# Patient Record
Sex: Male | Born: 1990 | Race: White | Hispanic: No | Marital: Single | Smoking: Current some day smoker
Health system: Southern US, Community
[De-identification: ages and names within clinical notes are randomized; demographics above are authoritative.]

---

## 2015-01-01 ENCOUNTER — Encounter (HOSPITAL_COMMUNITY): Payer: Self-pay | Admitting: Emergency Medicine

## 2015-01-01 ENCOUNTER — Emergency Department (HOSPITAL_COMMUNITY)
Admission: EM | Admit: 2015-01-01 | Discharge: 2015-01-02 | Disposition: A | Discharge status: Home / Self Care | Payer: 59 | Source: Home / Self Care | Attending: Emergency Medicine | Admitting: Emergency Medicine

## 2015-01-01 DIAGNOSIS — R5383 Other fatigue: Secondary | ICD-10-CM | POA: Insufficient documentation

## 2015-01-01 DIAGNOSIS — Z720 Tobacco use: Secondary | ICD-10-CM

## 2015-01-01 DIAGNOSIS — E860 Dehydration: Secondary | ICD-10-CM | POA: Insufficient documentation

## 2015-01-01 DIAGNOSIS — H538 Other visual disturbances: Secondary | ICD-10-CM | POA: Insufficient documentation

## 2015-01-01 DIAGNOSIS — R112 Nausea with vomiting, unspecified: Secondary | ICD-10-CM | POA: Insufficient documentation

## 2015-01-01 DIAGNOSIS — R42 Dizziness and giddiness: Secondary | ICD-10-CM | POA: Insufficient documentation

## 2015-01-01 DIAGNOSIS — M6281 Muscle weakness (generalized): Secondary | ICD-10-CM | POA: Insufficient documentation

## 2015-01-01 DIAGNOSIS — H81399 Other peripheral vertigo, unspecified ear: Secondary | ICD-10-CM | POA: Diagnosis not present

## 2015-01-01 LAB — CBC
HCT: 48.6 % (ref 39.0–52.0)
HEMOGLOBIN: 17.2 g/dL — AB (ref 13.0–17.0)
MCH: 29.5 pg (ref 26.0–34.0)
MCHC: 35.4 g/dL (ref 30.0–36.0)
MCV: 83.2 fL (ref 78.0–100.0)
Platelets: 249 10*3/uL (ref 150–400)
RBC: 5.84 MIL/uL — AB (ref 4.22–5.81)
RDW: 12.5 % (ref 11.5–15.5)
WBC: 10.2 10*3/uL (ref 4.0–10.5)

## 2015-01-01 LAB — URINALYSIS, ROUTINE W REFLEX MICROSCOPIC
Glucose, UA: NEGATIVE mg/dL
Hgb urine dipstick: NEGATIVE
KETONES UR: 40 mg/dL — AB
LEUKOCYTES UA: NEGATIVE
NITRITE: NEGATIVE
PH: 5.5 (ref 5.0–8.0)
Protein, ur: NEGATIVE mg/dL
SPECIFIC GRAVITY, URINE: 1.029 (ref 1.005–1.030)
Urobilinogen, UA: 0.2 mg/dL (ref 0.0–1.0)

## 2015-01-01 LAB — COMPREHENSIVE METABOLIC PANEL
ALT: 44 U/L (ref 17–63)
AST: 28 U/L (ref 15–41)
Albumin: 4.7 g/dL (ref 3.5–5.0)
Alkaline Phosphatase: 74 U/L (ref 38–126)
Anion gap: 14 (ref 5–15)
BUN: 13 mg/dL (ref 6–20)
CALCIUM: 9.9 mg/dL (ref 8.9–10.3)
CO2: 24 mmol/L (ref 22–32)
Chloride: 101 mmol/L (ref 101–111)
Creatinine, Ser: 1.03 mg/dL (ref 0.61–1.24)
GFR calc Af Amer: >60 mL/min (ref >60)
Glucose, Bld: 99 mg/dL (ref 65–99)
Potassium: 3.9 mmol/L (ref 3.5–5.1)
Sodium: 139 mmol/L (ref 135–145)
Total Bilirubin: 1 mg/dL (ref 0.3–1.2)
Total Protein: 8.2 g/dL — ABNORMAL HIGH (ref 6.5–8.1)

## 2015-01-01 LAB — LIPASE, BLOOD: Lipase: 18 U/L — ABNORMAL LOW (ref 22–51)

## 2015-01-01 MED ORDER — ONDANSETRON 4 MG PO TBDP
4.0000 mg | ORAL_TABLET | Freq: Once | ORAL | Status: AC | PRN
  Administered 2015-01-01: 4 mg via ORAL

## 2015-01-01 MED ORDER — ONDANSETRON HCL 4 MG PO TABS: 4.0000 mg | ORAL_TABLET | Freq: Three times a day (TID) | ORAL | Status: AC | PRN

## 2015-01-01 MED ORDER — ONDANSETRON 4 MG PO TBDP
ORAL_TABLET | ORAL | Status: AC
  Administered 2015-01-01: 4 mg via ORAL
  Filled 2015-01-01: qty 1

## 2015-01-01 MED ORDER — SODIUM CHLORIDE 0.9 % IV BOLUS (SEPSIS)
1000.0000 mL | Freq: Once | INTRAVENOUS | Status: AC
  Administered 2015-01-01: 1000 mL via INTRAVENOUS

## 2015-01-01 MED ORDER — ONDANSETRON HCL 4 MG/2ML IJ SOLN
4.0000 mg | Freq: Once | INTRAMUSCULAR | Status: AC
  Administered 2015-01-01: 4 mg via INTRAVENOUS
  Filled 2015-01-01: qty 2

## 2015-01-01 NOTE — Discharge Instructions (Signed)

## 2015-01-01 NOTE — ED Provider Notes (Signed)
CSN: [id number]     Arrival date & time 01/01/15  2011 History   First MD Initiated Contact with Patient 01/01/15 2202     Chief Complaint  Patient presents with  . Dizziness  . Emesis     (Consider location/radiation/quality/duration/timing/severity/associated sxs/prior Treatment) HPI   24 year old generally healthy male here with dizziness. Pt states his birthday was 2 days ago.  He admits to consuming alcohol (had 9 shots of liquor).  Woke up the next day and felt fine.  Patient reportedly work outside in his garage all day yesterday in extreme heat. Despite staying hydrated by drinking "alot of water" pt reportedly felt dizzy by mid day, having blurred vision, and generally weak and fatigued. Symptoms worsen with positional change. Today he has been endorsing nausea and has vomited multiple times. He cannot keep anything down. Continue to endorse dizziness. He described dizziness as "he is spinning". Also endorsed intermittent chest pain on palpation full the past several months none currently. He denies having any fever, headache, ear pain, loss of hearing, chest pain, difficulty breathing, productive cough, abdominal pain, back pain, dysuria, focal numbness or weakness. No recent travel or eating exotic food. Report occasional shortness of breath during the dizzy episode but not currently.       History reviewed. No pertinent past medical history. History reviewed. No pertinent past surgical history. History reviewed. No pertinent family history. History  Substance Use Topics  . Smoking status: Current Some Day Smoker  . Smokeless tobacco: Not on file  . Alcohol Use: Yes    Review of Systems  All other systems reviewed and are negative.     Allergies  Review of patient's allergies indicates no known allergies.  Home Medications   Prior to Admission medications   Not on File   BP 151/90 mmHg  Pulse 88  Temp(Src) 97.7 F (36.5 C) (Oral)  Resp 16  Ht  (1.854 m)   Wt 215 lb (97.523 kg)  BMI 28.37 kg/m2  SpO2 96% Physical Exam  Constitutional: He is oriented to person, place, and time. He appears well-developed and well-nourished. No distress.  Awake, alert, nontoxic appearance  HENT:  Head: Atraumatic.  Right Ear: External ear normal.  Left Ear: External ear normal.  Mouth/Throat: Oropharynx is clear and moist.  Eyes: Conjunctivae and EOM are normal. Pupils are equal, round, and reactive to light. Right eye exhibits no discharge. Left eye exhibits no discharge.  No nystagmus  Neck: Normal range of motion. Neck supple.  Cardiovascular: Normal rate and regular rhythm.   Pulmonary/Chest: Effort normal. No respiratory distress. He exhibits no tenderness.  Abdominal: Soft. There is no tenderness. There is no rebound.  Musculoskeletal: He exhibits no tenderness.  ROM appears intact, no obvious focal weakness  Neurological: He is alert and oriented to person, place, and time.  Normal gait.  Skin: Skin is warm and dry. No rash noted.  Psychiatric: He has a normal mood and affect.  Nursing note and vitals reviewed.   ED Course  Procedures (including critical care time)  Patient presents with symptoms suggestive of heat exhaustion. He has a benign abdomen suspicion for biliary disease or gastroenteritis. No acute abdomen. Urine with 40 ketones and evidence of hemoconcentration suggestive of dehydration. No signs of infection. Will give IV fluid and antinausea medication and will monitor closely. Symptoms likely peripheral, doubt central vertigo.  12:00 AM Pt felt better after receiving IVF and antinausea medication.  Able to tolerates PO.  Stable for discharge.  Labs Review Labs Reviewed  LIPASE, BLOOD - Abnormal; Notable for the following:    Lipase 18 (*)    All other components within normal limits  COMPREHENSIVE METABOLIC PANEL - Abnormal; Notable for the following:    Total Protein 8.2 (*)    All other components within normal limits   CBC - Abnormal; Notable for the following:    RBC 5.84 (*)    Hemoglobin 17.2 (*)    All other components within normal limits  URINALYSIS, ROUTINE W REFLEX MICROSCOPIC (NOT AT [facility]) - Abnormal; Notable for the following:    Bilirubin Urine SMALL (*)    Ketones, ur 40 (*)    All other components within normal limits    Imaging Review No results found.   EKG Interpretation None      MDM   Final diagnoses:  Non-intractable vomiting with nausea, vomiting of unspecified type  Dehydration    BP 124/77 mmHg  Pulse 70  Temp(Src) 97.5 F (36.4 C) (Oral)  Resp 16  Ht 6\' 1"  (1.854 m)  Wt 215 lb (97.523 kg)  BMI 28.37 kg/m2  SpO2 97%     Fayrene Helper, PA-C 01/02/15 0001  Pricilla Loveless, MD 01/02/15 [phone]

## 2015-01-01 NOTE — ED Notes (Signed)
Patient here with complaining of nausea, emesis, and dizziness. Onset about 2 days ago. States heavy night of drinking on Thurs and hasn't felt right since that time. States 15-20 bouts of vomiting. Actively vomiting in triage.

## 2015-01-02 ENCOUNTER — Emergency Department (HOSPITAL_COMMUNITY): Payer: 59

## 2015-01-02 ENCOUNTER — Encounter (HOSPITAL_COMMUNITY): Payer: Self-pay | Admitting: Emergency Medicine

## 2015-01-02 ENCOUNTER — Emergency Department (HOSPITAL_COMMUNITY)
Admission: EM | Admit: 2015-01-02 | Discharge: 2015-01-02 | Disposition: A | Discharge status: Home / Self Care | Payer: 59 | Attending: Emergency Medicine | Admitting: Emergency Medicine

## 2015-01-02 DIAGNOSIS — H81399 Other peripheral vertigo, unspecified ear: Secondary | ICD-10-CM

## 2015-01-02 DIAGNOSIS — R112 Nausea with vomiting, unspecified: Secondary | ICD-10-CM | POA: Insufficient documentation

## 2015-01-02 DIAGNOSIS — Z720 Tobacco use: Secondary | ICD-10-CM | POA: Insufficient documentation

## 2015-01-02 LAB — CBC WITH DIFFERENTIAL/PLATELET
Basophils Absolute: 0 10*3/uL (ref 0.0–0.1)
Basophils Relative: 0 % (ref 0–1)
EOS ABS: 0 10*3/uL (ref 0.0–0.7)
EOS PCT: 0 % (ref 0–5)
HEMATOCRIT: 45.1 % (ref 39.0–52.0)
Hemoglobin: 15.9 g/dL (ref 13.0–17.0)
LYMPHS ABS: 2.1 10*3/uL (ref 0.7–4.0)
Lymphocytes Relative: 20 % (ref 12–46)
MCH: 29.2 pg (ref 26.0–34.0)
MCHC: 35.3 g/dL (ref 30.0–36.0)
MCV: 82.8 fL (ref 78.0–100.0)
Monocytes Absolute: 0.5 10*3/uL (ref 0.1–1.0)
Monocytes Relative: 5 % (ref 3–12)
Neutro Abs: 7.9 10*3/uL — ABNORMAL HIGH (ref 1.7–7.7)
Neutrophils Relative %: 75 % (ref 43–77)
PLATELETS: 231 10*3/uL (ref 150–400)
RBC: 5.45 MIL/uL (ref 4.22–5.81)
RDW: 12.4 % (ref 11.5–15.5)
WBC: 10.5 10*3/uL (ref 4.0–10.5)

## 2015-01-02 LAB — COMPREHENSIVE METABOLIC PANEL
ALT: 34 U/L (ref 17–63)
ANION GAP: 9 (ref 5–15)
AST: 22 U/L (ref 15–41)
Albumin: 4.5 g/dL (ref 3.5–5.0)
Alkaline Phosphatase: 66 U/L (ref 38–126)
BUN: 11 mg/dL (ref 6–20)
CO2: 24 mmol/L (ref 22–32)
Calcium: 9.6 mg/dL (ref 8.9–10.3)
Chloride: 107 mmol/L (ref 101–111)
Creatinine, Ser: 1 mg/dL (ref 0.61–1.24)
GFR calc non Af Amer: >60 mL/min (ref >60)
Glucose, Bld: 87 mg/dL (ref 65–99)
Potassium: 3.9 mmol/L (ref 3.5–5.1)
SODIUM: 140 mmol/L (ref 135–145)
Total Bilirubin: 0.9 mg/dL (ref 0.3–1.2)
Total Protein: 8 g/dL (ref 6.5–8.1)

## 2015-01-02 LAB — LIPASE, BLOOD: LIPASE: 23 U/L (ref 22–51)

## 2015-01-02 MED ORDER — SODIUM CHLORIDE 0.9 % IV BOLUS (SEPSIS)
1000.0000 mL | Freq: Once | INTRAVENOUS | Status: AC
  Administered 2015-01-02: 1000 mL via INTRAVENOUS

## 2015-01-02 MED ORDER — ONDANSETRON 4 MG PO TBDP
4.0000 mg | ORAL_TABLET | Freq: Once | ORAL | Status: AC | PRN
  Administered 2015-01-02: 4 mg via ORAL

## 2015-01-02 MED ORDER — ONDANSETRON 4 MG PO TBDP
ORAL_TABLET | ORAL | Status: AC
  Filled 2015-01-02: qty 1

## 2015-01-02 MED ORDER — MECLIZINE HCL 25 MG PO TABS
25.0000 mg | ORAL_TABLET | Freq: Once | ORAL | Status: AC
Start: 2015-01-02 — End: 2015-01-02
  Administered 2015-01-02: 25 mg via ORAL
  Filled 2015-01-02: qty 1

## 2015-01-02 MED ORDER — MECLIZINE HCL 50 MG PO TABS: 25.0000 mg | ORAL_TABLET | Freq: Three times a day (TID) | ORAL | Status: AC | PRN

## 2015-01-02 NOTE — ED Notes (Signed)
Pt pulse ox while ambulating was 99%. Pt heart rate was 99.

## 2015-01-02 NOTE — ED Notes (Signed)
Pt sts N/V and dizziness; pt seen here last night for same and not feeling better

## 2015-01-02 NOTE — ED Provider Notes (Signed)
CSN: [id number]     Arrival date & time 01/02/15  1625 History   First MD Initiated Contact with Patient 01/02/15 1814     Chief Complaint  Patient presents with  . Dizziness     (Consider location/radiation/quality/duration/timing/severity/associated sxs/prior Treatment) HPI   24 year old male who presents for evaluation of nausea vomiting and dizziness. Patient report he drank alcohol 3 days ago for his birthday. The next day he worked out in his garage in the heat and subsequently became dizzy which he described as "I'm spinning but the surrounding is still". The next day he became nauseous and has vomited multiple times. He was seen in the ED yesterday for this complaint. He is found to be dehydrated and was given IV fluid and antinausea medication. He was given a prescription of Zofran at discharge. Patient states he did not fill the medication. Today he did vomit once and continue to endorse dizziness which prompted him to return to ER.  He received a zofran in triage and now felt better and nausea has resolved.  Dizziness still persists.  No headache, neck pain, vision changes, new numbness/weakness, cp, sob.    History reviewed. No pertinent past medical history. History reviewed. No pertinent past surgical history. History reviewed. No pertinent family history. History  Substance Use Topics  . Smoking status: Current Some Day Smoker  . Smokeless tobacco: Not on file  . Alcohol Use: Yes    Review of Systems  All other systems reviewed and are negative.     Allergies  Review of patient's allergies indicates no known allergies.  Home Medications   Prior to Admission medications   Medication Sig Start Date End Date Taking? Authorizing Provider  ondansetron (ZOFRAN) 4 MG tablet Take 1 tablet (4 mg total) by mouth every 8 (eight) hours as needed for nausea or vomiting. 01/01/15  Yes Fayrene Helper, PA-C   BP 124/81 mmHg  Pulse 67  Temp(Src) 97.6 F (36.4 C) (Oral)  Resp 18   SpO2 97% Physical Exam  Constitutional: He is oriented to person, place, and time. He appears well-developed and well-nourished. No distress.  HENT:  Head: Atraumatic.  Right Ear: External ear normal.  Left Ear: External ear normal.  Eyes: Conjunctivae and EOM are normal. Pupils are equal, round, and reactive to light.  Neck: Normal range of motion. Neck supple.  No nuchal rigidity  Neurological: He is alert and oriented to person, place, and time.  Neurologic exam:  Speech clear, pupils equal round reactive to light, extraocular movements intact  Normal peripheral visual fields Cranial nerves III through XII normal including no facial droop Follows commands, moves all extremities x4, normal strength to bilateral upper and lower extremities at all major muscle groups including grip Sensation normal to light touch  Coordination intact, no limb ataxia, finger-nose-finger normal Rapid alternating movements normal No pronator drift Gait normal   Skin: No rash noted.  Psychiatric: He has a normal mood and affect.  Nursing note and vitals reviewed.   ED Course  Procedures (including critical care time)  7:03 PM Patient here with persistent dizziness for the past 2 days. Pt was seen yesterday and was found to be dehydrated and was given fluid and antinausea medication. Nausea has improved however patient continues to endorse dizziness. His gait is mildly unstable when he looks up. He is concerned, therefore will obtain brain MRI to rule out posterior circulation stroke although my suspicion is low. Care discussed with Dr. Criss Alvine.  9:44 PM Labs are  reassuring, brain MRI without contrast shows no evidence of acute intracranial pathology. Patient was reassured. He is currently receiving IV fluid and antinausea medication along with anti-dizziness medication. He has normal orthostatic vital sign.  9:48 PM Pt felt normal while lying, but when ambulate HR rises to 99.  He will need to f/u  with PCP for further evaluation, possibly rule out for POTS.    18:31:54 Orthostatic Vital Signs AR  Orthostatic Lying  - BP- Lying: 125/73 mmHg ; Pulse- Lying: 86  Orthostatic Sitting - BP- Sitting: 140/74 mmHg ; Pulse- Sitting: 68  Orthostatic Standing at 0 minutes - BP- Standing at 0 minutes: 143/86 mmHg ; Pulse- Standing at 0 minutes: 75       Labs Review Labs Reviewed  CBC WITH DIFFERENTIAL/PLATELET - Abnormal; Notable for the following:    Neutro Abs 7.9 (*)    All other components within normal limits  COMPREHENSIVE METABOLIC PANEL  LIPASE, BLOOD    Imaging Review Mr Brain Wo Contrast  01/02/2015   CLINICAL DATA:  Nausea, vomiting and dizziness for the last 2 days.  EXAM: MRI HEAD WITHOUT CONTRAST  TECHNIQUE: Multiplanar, multiecho pulse sequences of the brain and surrounding structures were obtained without intravenous contrast.  COMPARISON:  None.  FINDINGS: The brain has a normal appearance on all pulse sequences without evidence of malformation, atrophy, old or acute infarction, mass lesion, hemorrhage, hydrocephalus or extra-axial collection. No pituitary mass. No fluid in the sinuses, middle ears or mastoids. No skull or skullbase lesion. There is flow in the major vessels at the base of the brain. Major venous sinuses show flow.  IMPRESSION: Normal examination.  No abnormality seen to explain dizziness.   Electronically Signed   By: Paulina Fusi M.D.   On: 01/02/2015 20:13     EKG Interpretation None      MDM   Final diagnoses:  Peripheral vertigo, unspecified laterality    BP 134/86 mmHg  Pulse 57  Temp(Src) 97.6 F (36.4 C) (Oral)  Resp 16  SpO2 99%  I have reviewed nursing notes and vital signs. I personally viewed the imaging tests through PACS system and agrees with radiologist's intepretation I reviewed available ER/hospitalization records through the EMR      Fayrene Helper, PA-C 01/02/15 2156  Pricilla Loveless, MD 01/05/15 [phone]

## 2015-01-02 NOTE — ED Notes (Addendum)
Pt here for nausea vomiting and dizziness. Seen yesterday for same. Pt visitor at bedside states patient is actually a lot better than yesterday. Pt did not get his zofran prescription filled. Pt given zofran in triage and states his nausea is gone. PT ate [employer] while waiting and has kept it down.

## 2015-01-02 NOTE — Discharge Instructions (Signed)
Dizziness °Dizziness is a common problem. It is a feeling of unsteadiness or light-headedness. You may feel like you are about to faint. Dizziness can lead to injury if you stumble or fall. A person of any age group can suffer from dizziness, but dizziness is more common in older adults. °CAUSES  °Dizziness can be caused by many different things, including: °· Middle ear problems. °· Standing for too long. °· Infections. °· An allergic reaction. °· Aging. °· An emotional response to something, such as the sight of blood. °· Side effects of medicines. °· Tiredness. °· Problems with circulation or blood pressure. °· Excessive use of alcohol or medicines, or illegal drug use. °· Breathing too fast (hyperventilation). °· An irregular heart rhythm (arrhythmia). °· A low red blood cell count (anemia). °· Pregnancy. °· Vomiting, diarrhea, fever, or other illnesses that cause body fluid loss (dehydration). °· Diseases or conditions such as Parkinson's disease, high blood pressure (hypertension), diabetes, and thyroid problems. °· Exposure to extreme heat. °DIAGNOSIS  °Your health care provider will ask about your symptoms, perform a physical exam, and perform an electrocardiogram (ECG) to record the electrical activity of your heart. Your health care provider may also perform other heart or blood tests to determine the cause of your dizziness. These may include: °· Transthoracic echocardiogram (TTE). During echocardiography, sound waves are used to evaluate how blood flows through your heart. °· Transesophageal echocardiogram (TEE). °· Cardiac monitoring. This allows your health care provider to monitor your heart rate and rhythm in real time. °· Holter monitor. This is a portable device that records your heartbeat and can help diagnose heart arrhythmias. It allows your health care provider to track your heart activity for several days if needed. °· Stress tests by exercise or by giving medicine that makes the heart beat  faster. °TREATMENT  °Treatment of dizziness depends on the cause of your symptoms and can vary greatly. °HOME CARE INSTRUCTIONS  °· Drink enough fluids to keep your urine clear or pale yellow. This is especially important in very hot weather. In older adults, it is also important in cold weather. °· Take your medicine exactly as directed if your dizziness is caused by medicines. When taking blood pressure medicines, it is especially important to get up slowly. °¨ Rise slowly from chairs and steady yourself until you feel okay. °¨ In the morning, first sit up on the side of the bed. When you feel okay, stand slowly while holding onto something until you know your balance is fine. °· Move your legs often if you need to stand in one place for a long time. Tighten and relax your muscles in your legs while standing. °· Have someone stay with you for 1-2 days if dizziness continues to be a problem. Do this until you feel you are well enough to stay alone. Have the person call your health care provider if he or she notices changes in you that are concerning. °· Do not drive or use heavy machinery if you feel dizzy. °· Do not drink alcohol. °SEEK IMMEDIATE MEDICAL CARE IF:  °· Your dizziness or light-headedness gets worse. °· You feel nauseous or vomit. °· You have problems talking, walking, or using your arms, hands, or legs. °· You feel weak. °· You are not thinking clearly or you have trouble forming sentences. It may take a friend or family member to notice this. °· You have chest pain, abdominal pain, shortness of breath, or sweating. °· Your vision changes. °· You notice   any bleeding. °· You have side effects from medicine that seems to be getting worse rather than better. °MAKE SURE YOU:  °· Understand these instructions. °· Will watch your condition. °· Will get help right away if you are not doing well or get worse. °Document Released: 11/21/2000 Document Revised: 06/02/2013 Document Reviewed: 12/15/2010 °ExitCare®  Patient Information ©2015 [employer]. This information is not intended to replace advice given to you by your health care provider. Make sure you discuss any questions you have with your health care provider. ° ° °Emergency Department Resource Guide °1) Find a Doctor and Pay Out of Pocket °Although you won't have to find out who is covered by your insurance plan, it is a good idea to ask around and get recommendations. You will then need to call the office and see if the doctor you have chosen will accept you as a new patient and what types of options they offer for patients who are self-pay. Some doctors offer discounts or will set up payment plans for their patients who do not have insurance, but you will need to ask so you aren't surprised when you get to your appointment. ° °2) Contact Your Local Health Department °Not all health departments have doctors that can see patients for sick visits, but many do, so it is worth a call to see if yours does. If you don't know where your local health department is, you can check in your phone book. The CDC also has a tool to help you locate your state's health department, and many state websites also have listings of all of their local health departments. ° °3) Find a Walk-in Clinic °If your illness is not likely to be very severe or complicated, you may want to try a walk in clinic. These are popping up all over the country in pharmacies, drugstores, and shopping centers. They're usually staffed by nurse practitioners or physician assistants that have been trained to treat common illnesses and complaints. They're usually fairly quick and inexpensive. However, if you have serious medical issues or chronic medical problems, these are probably not your best option. ° °No Primary Care Doctor: °- Call [facility] at  832-8000 - they can help you locate a primary care doctor that  accepts your insurance, provides certain services, etc. °- Physician Referral Service-  1-800-533-3463 ° °Chronic Pain Problems: °Organization         Address  Phone   Notes  °[facility]  (336) 297-2271 Patients need to be referred by their primary care doctor.  ° °Medication Assistance: °Organization         Address  Phone   Notes  °[place] [facility] [address] °[place], [place] [address] (336) 641-8030 --Must be a resident of [place] °-- Must have NO insurance coverage whatsoever (no Medicaid/ Medicare, etc.) °-- The pt. MUST have a primary care doctor that directs their care regularly and follows them in the community °  °[facility]  (866) 331-1348   °[employer]  (888) 892-1162   ° °Agencies that provide inexpensive medical care: °Organization         Address  Phone   Notes  °[facility]  (336) 832-8035   °[facility]    (336) 832-7272   °[facility] [address] °[place], [place] [address] (336) 832-4777   °[facility] [address], °[place] (336) 271-4999   °[facility]    (  336) 373-0678   °[facility]    (336) 272-1050   °[facility] ° [address], [place] Phone:  (336) 832-4444, Fax:  (336) 832-4440 Hours of Operation:  9 am - 6 pm, M-F.  Also accepts Medicaid/Medicare and self-pay.  °[facility] ° [address], [place] Phone: (336) 832-3150, Fax: (336) 832-3151. Hours of Operation:  8:30 am - 5:30 pm, M-F.  Also accepts Medicaid and self-pay.  °[facility] [place] [address], [place] Phone: (336) 878-6027   °[facility] [address], [place], [place] (336)723-1848, Ext. 123 Mondays & Thursdays: 7-9 AM.  First 15 patients are seen on a first come, first serve basis. °  ° °Medicaid-accepting [place] Providers: ° °Organization         Address  Phone   Notes  °[facility] [address],  [place] (336) 641-2100 Also accepts self-pay patients.  °[facility] [address], [place] ° (336) 856-9996   °[facility] [address], [place] (336) 288-8857   °[facility] [address] [place] [address], [place] (336) 299-7000   °Veita Bland [address], [place]  ° (336) 373-1557 Only accepts [place] Access Medicaid patients after they have their name applied to their card.  ° °Self-Pay (no insurance) in [place]: ° °Organization         Address  Phone   Notes  °Sickle Cell Patients, [facility] [address], [place] (336) 832-1970   °[facility] [address], [place] (336) 832-4400   °[facility] ° [address], [place] (336) 992-4800   °[facility]/Dr. Osei-Bonsu ° [address] [place] [address], [place] or [address], [place] (336) [phone] Phone number for both [place] and [place] locations is the same.  °[facility] [address], [place] (336) 299-0000   °[facility] [address] [place] [address], [place] or [address] (336) 852-7530 °(336) 878-2260   °[facility] [address],  (336) 350-1642, phone; (336) 294-5005, fax Sees patients 1st and 3rd Saturday of every month.  Must not qualify for public or private insurance (i.e. Medicaid, Medicare, [place] Health Choice, Veterans' Benefits) • Household income should be no more than 200% of the poverty level •The clinic cannot treat you if you are pregnant or think you are pregnant • Sexually transmitted diseases are not treated at the clinic.  ° ° °Dental Care: °Organization         Address  Phone  Notes  °[facility] [address],  (336) 641-6152 Accepts children up to age 21 who are enrolled in  Medicaid or [place] Health Choice; pregnant women with a Medicaid card; and children who have applied for Medicaid or [place] Health Choice, but were declined, whose parents can pay a reduced fee at time of service.  °[facility] [place]  [address], [place] (336) 641-7733 Accepts children up to age 21 who are enrolled in Medicaid or [place] Health Choice; pregnant women with a Medicaid card; and children who have applied for Medicaid or  Health Choice, but were declined, whose parents can pay a   reduced fee at time of service.  °[facility] ° [address], [place] (336) 641-4533 Patients are seen by appointment only. Walk-ins are not accepted. [facility] will see patients 18 years of age and older. °Monday - Tuesday (8am-5pm) °Most Wednesdays (8:30-5pm) °$30 per visit, cash only  °[facility] ° [address], [place] (336) 641-4533 Patients are seen by appointment only. Walk-ins are not accepted. [facility] will see patients 18 years of age and older. °One Wednesday Evening (Monthly: Volunteer Based).  $30 per visit, cash only  °[facility]  (919) 537-3737 for adults; Children under age 4, call Graduate Pediatric Dentistry at ([phone]) [phone]. Children aged 4-14, please call ([phone]) [phone] to request a pediatric application. ° Dental services are provided in all areas of dental care including fillings, crowns and bridges, complete and partial dentures, implants, gum treatment, root canals, and extractions. Preventive care is also provided. Treatment is provided to both adults and children. °Patients are selected via a lottery and there is often a waiting list. °  °[facility] [address], °[place] ° (336) 763-8833 [email or web address] °  °[facility] [address], [place], [place] (336)723-1848, Ext. 123 Second and Fourth Thursday of each month, opens at 6:30  AM; Clinic ends at 9 AM.  Patients are seen on a first-come first-served basis, and a limited number are seen during each clinic.  ° °[facility] ° [address], [place], [place] (336) 723-7904   Eligibility Requirements °You must have lived in [place], [place], or [place] counties for at least the last three months. °  You cannot be eligible for state or federal sponsored healthcare insurance, including Veterans Administration, Medicaid, or Medicare. °  You generally cannot be eligible for healthcare insurance through your employer.  °  How to apply: °Eligibility screenings are held every Tuesday and Wednesday afternoon from 1:00 pm until 4:00 pm. You do not need an appointment for the interview!  °[facility] [address], [place], [place] 336-631-2330   °[facility]  336-342-8273   °[facility]  336-703-3100   ° [facility]  336-570-6415   ° °Behavioral Health Resources in the Community: °Intensive Outpatient Programs °Organization         Address  Phone  Notes  °[facility] [address], [place], [place] 336-878-6098   °[facility] [address], [place], [place] 336-832-9800   °[facility] [address], [place], [place] ° 336-882-2125   °[facility] [address],  °, [place] 1-800-853-5163 or 336-641-4981   °Substance Abuse Resources °Organization         Address  Phone  Notes  °[facility]  336-882-2125   °[facility]  336-784-9470   °[facility]  336-285-9073   °[facility]  336-845-3988   °Residential & Outpatient Substance Abuse Program  1-800-659-3381   °Psychological Services °Organization         Address  Phone  Notes  °[facility]  336- 832-9600   °[facility]  336- 378-7881   °[facility] [address],  1-800-853-5163 or  336-641-4981   ° °Mobile Crisis Teams °Organization         Address  Phone  Notes  °[facility], Mobile Crisis Care Unit  1-877-626-1772   °  Assertive °[facility] ° [address] [place], [place] 336-834-9664   °Sharon DeEsch [address] °[place] 336-554-5454   ° °Self-Help/Support Groups °Organization         Address  Phone             Notes  °[facility] - variety of support groups  336- 373-1402 Call for more information  °Narcotics Anonymous (NA), [facility] [address], °[place]  2 meetings at this location  ° °Residential Treatment Programs °Organization         Address  Phone  Notes  °[facility] [address],    °[place]  1-866-801-8205   °[facility] ° [address], [place], [place] 704-293-8524   °[facility] [address], [place] 336-845-3988 Admissions: 8am-3pm M-F  °[facility] [address]    °[place], [place] 336-841-1104   °[facility] [address], [place], [place] 336-379-7146   °[facility] [address]  °[place], [place] 336-285-9073   °[facility] Intensive Outpatient [address], [place], [place] 336-852-3033   °[facility] [address]  °[place], [place] 1-877-615-2722 or 336-784-9470   °[facility] [address] [place], [place] 336-227-7417 Accepts Medicaid  °[facility] [address]  °[place] 1-800-659-3381 Substance Abuse/Addiction Treatment  ° °[place] Behavioral Health Resources °Organization         Address  Phone  Notes  °[facility]  (888) 581-9988   °Julie Brannon, PhD [address] [place], [place]   (336) 349-5553 or (336) 951-0000   °[facility]   [address] °[place], [place] (336) 349-4454   °[facility] [address],  [place], [place] (336) 342-8316 Insurance/Medicaid/sponsorship through [facility]  °[facility] [address]                                    [place], [place] (336) 342-8316 Therapy/tele-psych/case  °[facility] [address]  ° [place], [place] (336) 349-2233    °Dr. Arfeen  (336) 349-4544   °[facility] [place]  [employer] [place] [facility] 1) [address], [place] °2) [address], [place] °3)  [address], [place] (336) 349-3220 °(336) 342-7768 ° °(336) 342-8140   °[place] [facility] (336) 342-1394 or (336) 342-3537 (After Hours)    ° °  °

## 2016-09-21 IMAGING — MR MR HEAD W/O CM
9 of 11 series · 33 of 48 positions shown · non-contrast
Comparison: None.

CLINICAL DATA: Nausea, vomiting and dizziness for the last 2 days.

EXAM:
MRI HEAD WITHOUT CONTRAST
TECHNIQUE: Multiplanar, multiecho pulse sequences of the brain and surrounding
structures were obtained without intravenous contrast.

[Series 3: DWI · axial · 3.0mm · 0.94mm/px · z∈[-95,+70]mm · 7 of 112 slices shown (1 of 4)]
[im 1/112]
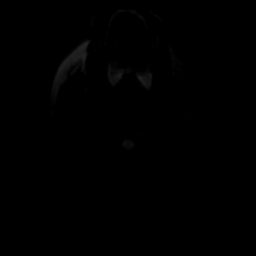
[im 19/112]
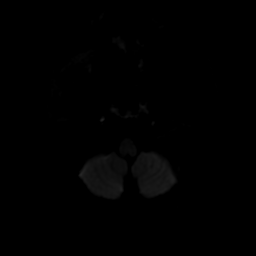
[im 38/112]
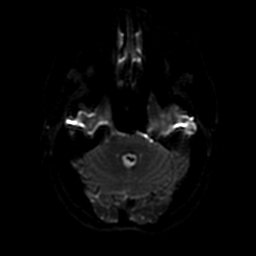
[im 56/112]
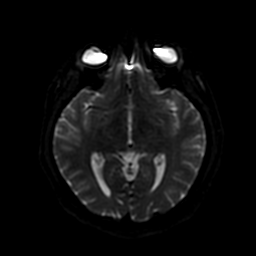
[im 75/112]
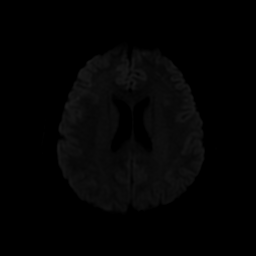
[im 93/112]
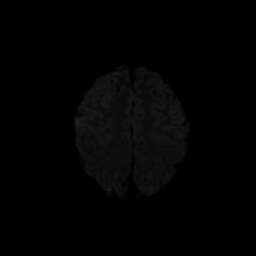
[im 112/112]
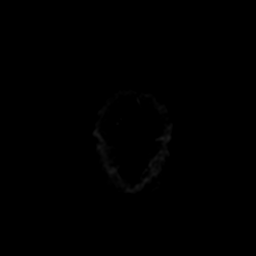

[Series 4: FLAIR · sagittal · 5.0mm · 0.47mm/px · 2 of 26 slices shown (1 of 2)]
[im 1/26]
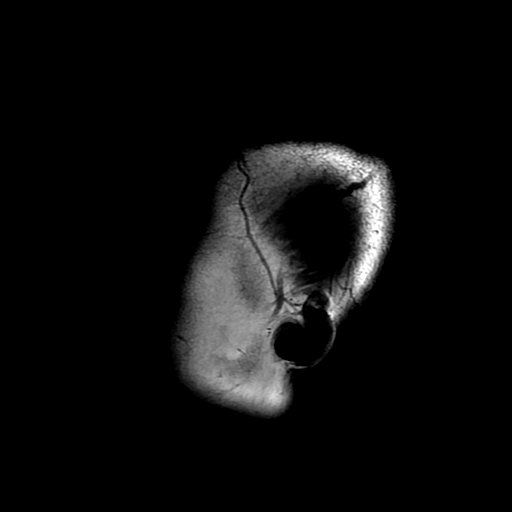
[im 26/26]
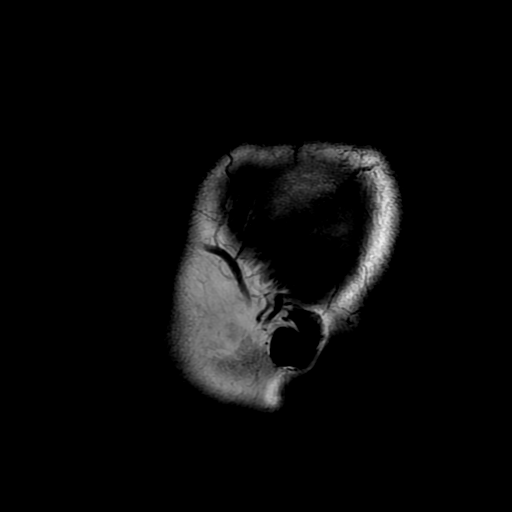

[Series 5: T2 · axial · 5.0mm · 0.47mm/px · z∈[-74,+94]mm · 2 of 29 slices shown (1 of 2)]
[im 1/29]
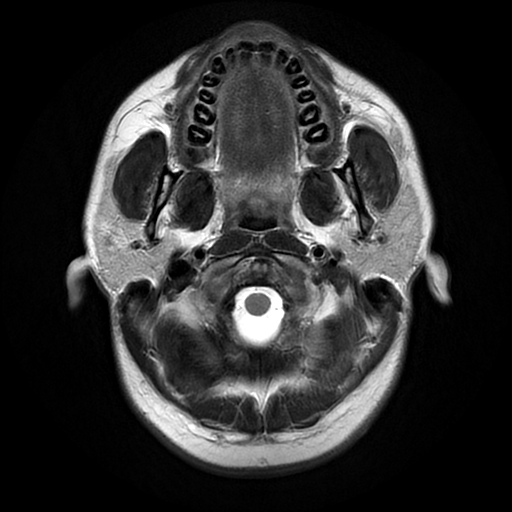
[im 29/29]
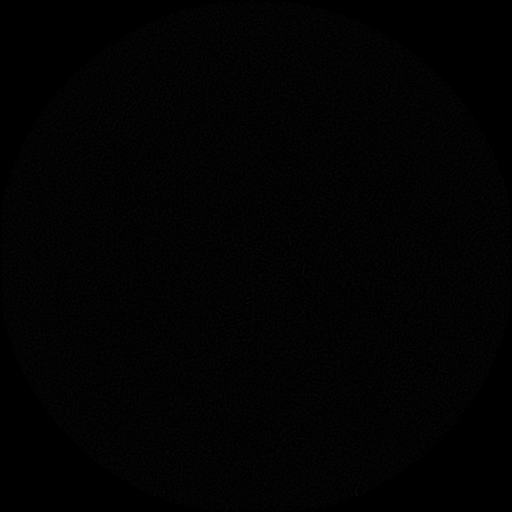

[Series 6: FLAIR · axial · 5.0mm · 0.47mm/px · z∈[-74,+94]mm · 2 of 29 slices shown (2 of 2)]
[im 1/29]
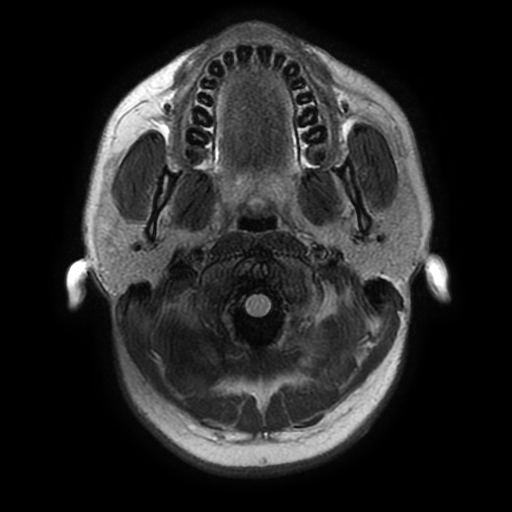
[im 29/29]
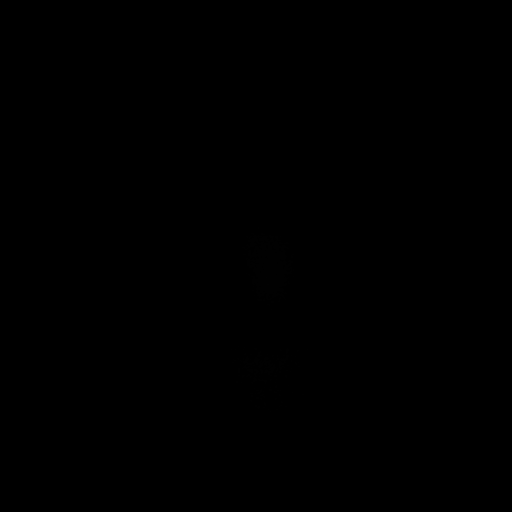

[Series 7: DWI · coronal · 5.0mm · 0.94mm/px · 5 of 76 slices shown (2 of 4)]
[im 1/76]
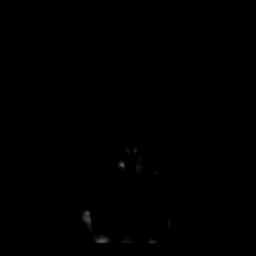
[im 19/76]
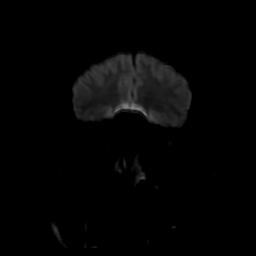
[im 38/76]
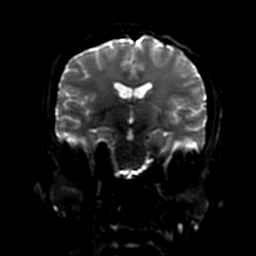
[im 57/76]
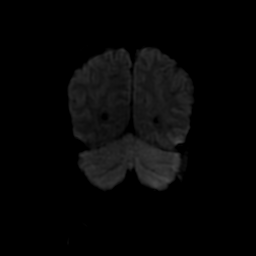
[im 76/76]
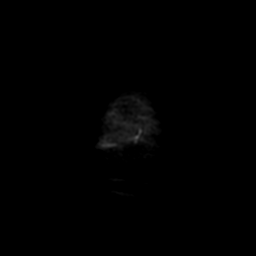

[Series 8: (person_name) · axial · 3.0mm · 0.47mm/px · z∈[-83,+31]mm · 6 of 108 slices shown]
[im 1/108]
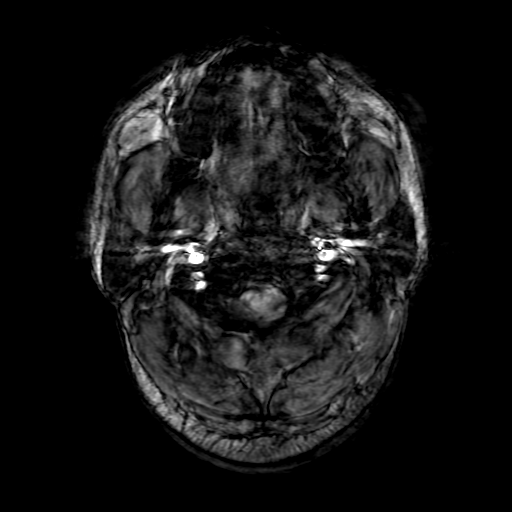
[im 16/108]
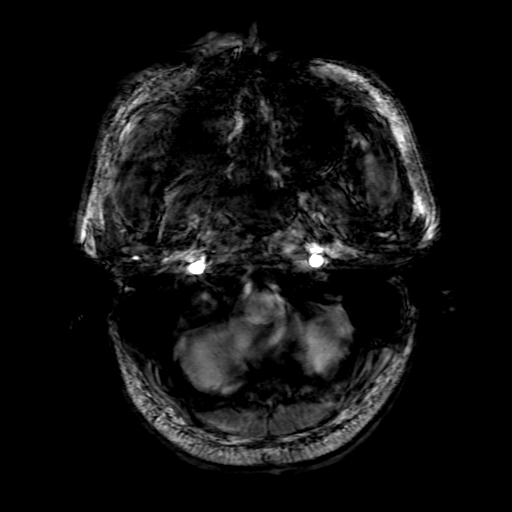
[im 31/108]
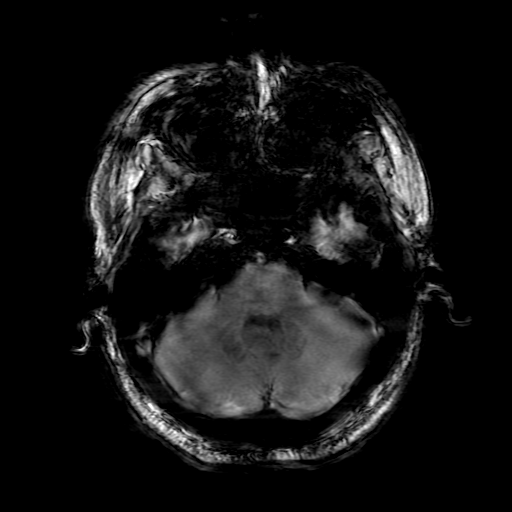
[im 46/108]
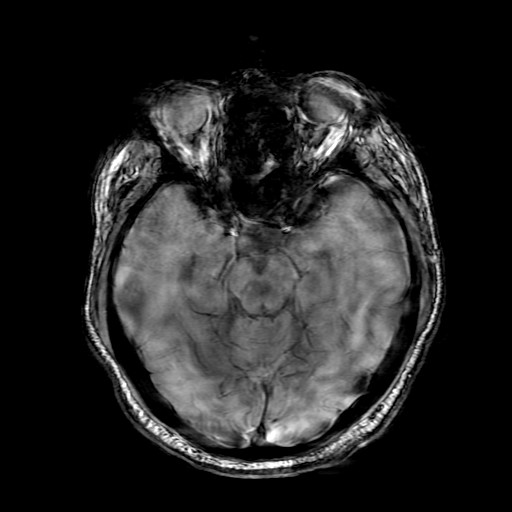
[im 62/108]
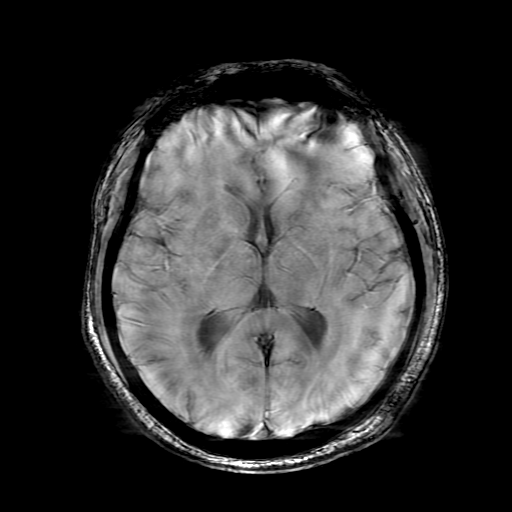
[im 77/108]
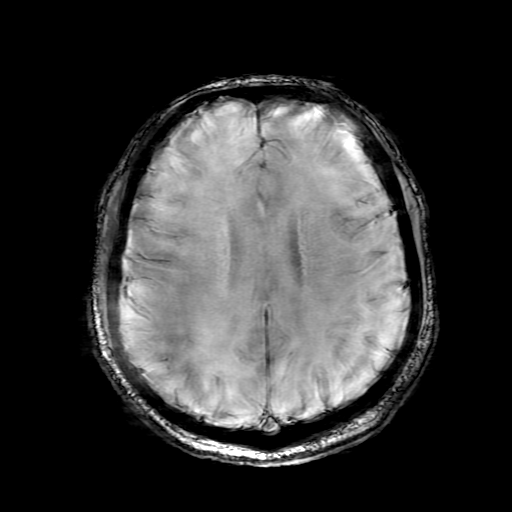

[Series 10: T2 · coronal · 5.0mm · 0.47mm/px · 2 of 29 slices shown (2 of 2)]
[im 1/29]
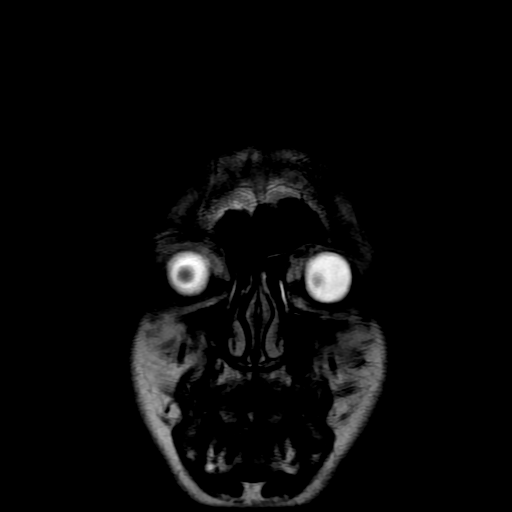
[im 29/29]
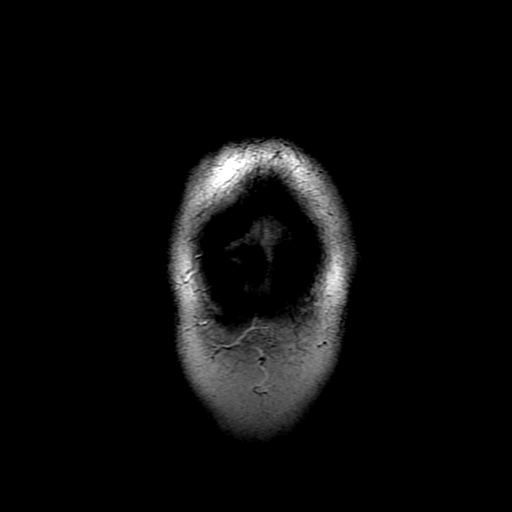

[Series 300: DWI · axial · 3.0mm · 0.94mm/px · z∈[-95,+70]mm · 4 of 56 slices shown (3 of 4)]
[im 1/56]
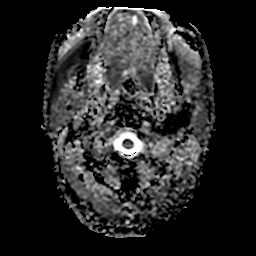
[im 19/56]
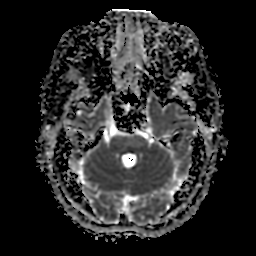
[im 37/56]
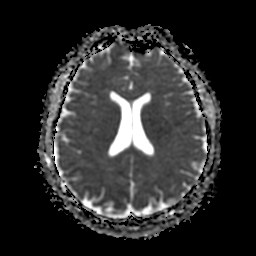
[im 56/56]
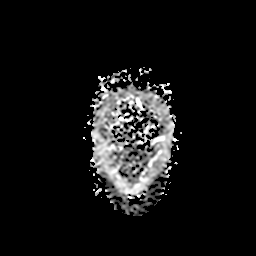

[Series 700: DWI · coronal · 5.0mm · 0.94mm/px · 3 of 38 slices shown (4 of 4)]
[im 1/38]
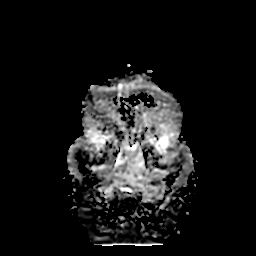
[im 19/38]
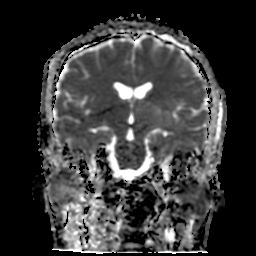
[im 38/38]
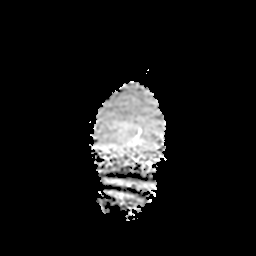

[33 of 48 positions shown; findings below may reference images not displayed]

FINDINGS: The brain has a normal appearance on all pulse sequences without
evidence of malformation, atrophy, old or acute infarction, mass
lesion, hemorrhage, hydrocephalus or extra-axial collection. No
pituitary mass. No fluid in the sinuses, middle ears or mastoids. No
skull or skullbase lesion. There is flow in the major vessels at the
base of the brain. Major venous sinuses show flow.
IMPRESSION: Normal examination.  No abnormality seen to explain dizziness.

## 2019-08-14 ENCOUNTER — Encounter (HOSPITAL_COMMUNITY): Payer: Self-pay | Admitting: *Deleted

## 2019-08-14 ENCOUNTER — Other Ambulatory Visit: Payer: Self-pay

## 2019-08-14 ENCOUNTER — Emergency Department (HOSPITAL_COMMUNITY)
Admission: EM | Admit: 2019-08-14 | Discharge: 2019-08-15 | Disposition: A | Discharge status: Other Acute Inpatient Hospital | Payer: 59 | Attending: Emergency Medicine | Admitting: Emergency Medicine

## 2019-08-14 DIAGNOSIS — F1290 Cannabis use, unspecified, uncomplicated: Secondary | ICD-10-CM | POA: Insufficient documentation

## 2019-08-14 DIAGNOSIS — Z20822 Contact with and (suspected) exposure to covid-19: Secondary | ICD-10-CM | POA: Insufficient documentation

## 2019-08-14 DIAGNOSIS — F15259 Other stimulant dependence with stimulant-induced psychotic disorder, unspecified: Secondary | ICD-10-CM | POA: Insufficient documentation

## 2019-08-14 DIAGNOSIS — F23 Brief psychotic disorder: Secondary | ICD-10-CM

## 2019-08-14 DIAGNOSIS — Z720 Tobacco use: Secondary | ICD-10-CM | POA: Insufficient documentation

## 2019-08-14 LAB — CBC
HCT: 47.3 % (ref 39.0–52.0)
Hemoglobin: 15.6 g/dL (ref 13.0–17.0)
MCH: 28.5 pg (ref 26.0–34.0)
MCHC: 33 g/dL (ref 30.0–36.0)
MCV: 86.5 fL (ref 80.0–100.0)
Platelets: 292 10*3/uL (ref 150–400)
RBC: 5.47 MIL/uL (ref 4.22–5.81)
RDW: 12.2 % (ref 11.5–15.5)
WBC: 8.5 10*3/uL (ref 4.0–10.5)
nRBC: 0 % (ref 0.0–0.2)

## 2019-08-14 LAB — ETHANOL: Alcohol, Ethyl (B): <10 mg/dL (ref <10)

## 2019-08-14 LAB — COMPREHENSIVE METABOLIC PANEL
ALT: 24 U/L (ref 0–44)
AST: 19 U/L (ref 15–41)
Albumin: 4.5 g/dL (ref 3.5–5.0)
Alkaline Phosphatase: 65 U/L (ref 38–126)
Anion gap: 9 (ref 5–15)
BUN: 12 mg/dL (ref 6–20)
CO2: 27 mmol/L (ref 22–32)
Calcium: 9.5 mg/dL (ref 8.9–10.3)
Chloride: 102 mmol/L (ref 98–111)
Creatinine, Ser: 1.06 mg/dL (ref 0.61–1.24)
GFR calc Af Amer: >60 mL/min (ref >60)
GFR calc non Af Amer: >60 mL/min (ref >60)
Glucose, Bld: 91 mg/dL (ref 70–99)
Potassium: 4.4 mmol/L (ref 3.5–5.1)
Sodium: 138 mmol/L (ref 135–145)
Total Bilirubin: 0.3 mg/dL (ref 0.3–1.2)
Total Protein: 7.6 g/dL (ref 6.5–8.1)

## 2019-08-14 LAB — RAPID URINE DRUG SCREEN, HOSP PERFORMED
Amphetamines: POSITIVE — AB
Barbiturates: NOT DETECTED
Benzodiazepines: NOT DETECTED
Cocaine: NOT DETECTED
Opiates: NOT DETECTED
Tetrahydrocannabinol: NOT DETECTED

## 2019-08-14 LAB — RESPIRATORY PANEL BY RT PCR (FLU A&B, COVID)
Influenza A by PCR: NEGATIVE
Influenza B by PCR: NEGATIVE
SARS Coronavirus 2 by RT PCR: NEGATIVE

## 2019-08-14 NOTE — ED Notes (Signed)
ED Provider at bedside. 

## 2019-08-14 NOTE — ED Provider Notes (Signed)
[facility] EMERGENCY DEPARTMENT Provider Note   CSN: [id number] Arrival date & time: 08/14/19  1955     History Chief Complaint  Patient presents with  . Psychiatric Evaluation    Jerome Lewis is a 29 y.o. male.  29 year old male who presents under IVC due to bizarre behavior including thinking that he has magnets in his skin.  Patient was felt to be a danger to himself.  Upon questioning, he denies any SI or HI.  Does admit to using Adderall this evening in an attempt to become intoxicated.  Denies any auditory visual hallucinations.  No other complaints noted        History reviewed. No pertinent past medical history.  There are no problems to display for this patient.   History reviewed. No pertinent surgical history.     No family history on file.  Social History   Tobacco Use  . Smoking status: Current Some Day Smoker  . Smokeless tobacco: Never Used  Substance Use Topics  . Alcohol use: Yes  . Drug use: Yes    Types: Marijuana    Home Medications Prior to Admission medications   Medication Sig Start Date End Date Taking? Authorizing Provider  meclizine (ANTIVERT) 50 MG tablet Take 0.5 tablets (25 mg total) by mouth 3 (three) times daily as needed for dizziness. Patient not taking: Reported on 08/14/2019 01/02/15   Domenic Moras, PA-C  ondansetron (ZOFRAN) 4 MG tablet Take 1 tablet (4 mg total) by mouth every 8 (eight) hours as needed for nausea or vomiting. Patient not taking: Reported on 08/14/2019 01/01/15   Domenic Moras, PA-C    Allergies    Patient has no known allergies.  Review of Systems   Review of Systems  All other systems reviewed and are negative.   Physical Exam Updated Vital Signs Ht 1.854 m (6\' 1" )   Wt 79.4 kg   BMI 23.09 kg/m   Physical Exam Vitals and nursing note reviewed.  Constitutional:      General: He is not in acute distress.    Appearance: Normal appearance. He is well-developed. He is not  toxic-appearing.  HENT:     Head: Normocephalic and atraumatic.  Eyes:     General: Lids are normal.     Conjunctiva/sclera: Conjunctivae normal.     Pupils: Pupils are equal, round, and reactive to light.  Neck:     Thyroid: No thyroid mass.     Trachea: No tracheal deviation.  Cardiovascular:     Rate and Rhythm: Normal rate and regular rhythm.     Heart sounds: Normal heart sounds. No murmur. No gallop.   Pulmonary:     Effort: Pulmonary effort is normal. No respiratory distress.     Breath sounds: Normal breath sounds. No stridor. No decreased breath sounds, wheezing, rhonchi or rales.  Abdominal:     General: Bowel sounds are normal. There is no distension.     Palpations: Abdomen is soft.     Tenderness: There is no abdominal tenderness. There is no rebound.  Musculoskeletal:        General: No tenderness. Normal range of motion.     Cervical back: Normal range of motion and neck supple.  Skin:    General: Skin is warm and dry.     Findings: No abrasion or rash.  Neurological:     Mental Status: He is alert and oriented to person, place, and time.     GCS: GCS eye subscore is  4. GCS verbal subscore is 5. GCS motor subscore is 6.     Cranial Nerves: No cranial nerve deficit.     Sensory: No sensory deficit.  Psychiatric:        Attention and Perception: Attention normal.        Mood and Affect: Mood is anxious.        Speech: Speech is rapid and pressured.        Behavior: Behavior is hyperactive.     ED Results / Procedures / Treatments   Labs (all labs ordered are listed, but only abnormal results are displayed) Labs Reviewed  RAPID URINE DRUG SCREEN, HOSP PERFORMED - Abnormal; Notable for the following components:      Result Value   Amphetamines POSITIVE (*)    All other components within normal limits  RESPIRATORY PANEL BY RT PCR (FLU A&B, COVID)  COMPREHENSIVE METABOLIC PANEL  ETHANOL  CBC    EKG None  Radiology No results  found.  Procedures Procedures (including critical care time)  Medications Ordered in ED Medications - No data to display  ED Course  I have reviewed the triage vital signs and the nursing notes.  Pertinent labs & imaging results that were available during my care of the patient were reviewed by me and considered in my medical decision making (see chart for details).    MDM Rules/Calculators/A&P                      Patient has been medically clear for psychiatric referral Final Clinical Impression(s) / ED Diagnoses Final diagnoses:  None    Rx / DC Orders ED Discharge Orders    None       Lorre Nick, MD 08/14/19 2216

## 2019-08-14 NOTE — ED Notes (Signed)
2 bags to locker 4 in purple

## 2019-08-14 NOTE — ED Triage Notes (Signed)
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

## 2019-08-15 ENCOUNTER — Encounter (HOSPITAL_COMMUNITY): Payer: Self-pay | Admitting: Psychiatry

## 2019-08-15 ENCOUNTER — Other Ambulatory Visit: Payer: Self-pay

## 2019-08-15 ENCOUNTER — Inpatient Hospital Stay (HOSPITAL_COMMUNITY)
Admission: AD | Admit: 2019-08-15 | Discharge: 2019-08-19 | DRG: 885 | Disposition: A | Discharge status: Home / Self Care | Payer: Federal, State, Local not specified - Other | Attending: Psychiatry | Admitting: Psychiatry

## 2019-08-15 DIAGNOSIS — F17210 Nicotine dependence, cigarettes, uncomplicated: Secondary | ICD-10-CM | POA: Diagnosis present

## 2019-08-15 DIAGNOSIS — F1410 Cocaine abuse, uncomplicated: Secondary | ICD-10-CM | POA: Diagnosis present

## 2019-08-15 DIAGNOSIS — F15959 Other stimulant use, unspecified with stimulant-induced psychotic disorder, unspecified: Secondary | ICD-10-CM | POA: Diagnosis present

## 2019-08-15 DIAGNOSIS — F1220 Cannabis dependence, uncomplicated: Secondary | ICD-10-CM | POA: Diagnosis present

## 2019-08-15 DIAGNOSIS — G4700 Insomnia, unspecified: Secondary | ICD-10-CM | POA: Diagnosis present

## 2019-08-15 DIAGNOSIS — F29 Unspecified psychosis not due to a substance or known physiological condition: Secondary | ICD-10-CM | POA: Diagnosis present

## 2019-08-15 DIAGNOSIS — F2081 Schizophreniform disorder: Principal | ICD-10-CM | POA: Diagnosis present

## 2019-08-15 MED ORDER — PRENATAL MULTIVITAMIN CH
1.0000 | ORAL_TABLET | Freq: Every day | ORAL | Status: DC
  Administered 2019-08-17: 1 via ORAL
  Filled 2019-08-15 (×5): qty 1

## 2019-08-15 MED ORDER — HYDROXYZINE HCL 25 MG PO TABS
25.0000 mg | ORAL_TABLET | Freq: Three times a day (TID) | ORAL | Status: DC | PRN
  Filled 2019-08-15: qty 1

## 2019-08-15 MED ORDER — TEMAZEPAM 30 MG PO CAPS
30.0000 mg | ORAL_CAPSULE | Freq: Every day | ORAL | Status: DC
  Administered 2019-08-15: 30 mg via ORAL
  Filled 2019-08-15 (×2): qty 1

## 2019-08-15 MED ORDER — OMEGA-3-ACID ETHYL ESTERS 1 G PO CAPS
1.0000 g | ORAL_CAPSULE | Freq: Two times a day (BID) | ORAL | Status: DC
  Administered 2019-08-19: 1 g via ORAL
  Filled 2019-08-15 (×11): qty 1

## 2019-08-15 MED ORDER — TEMAZEPAM 15 MG PO CAPS
15.0000 mg | ORAL_CAPSULE | Freq: Every evening | ORAL | Status: DC | PRN
  Administered 2019-08-18: 15 mg via ORAL
  Filled 2019-08-15: qty 1

## 2019-08-15 MED ORDER — ACETAMINOPHEN 325 MG PO TABS: 650.0000 mg | ORAL_TABLET | Freq: Four times a day (QID) | ORAL | Status: DC | PRN

## 2019-08-15 MED ORDER — RISPERIDONE 2 MG PO TABS
2.0000 mg | ORAL_TABLET | Freq: Two times a day (BID) | ORAL | Status: DC
  Administered 2019-08-15 – 2019-08-17 (×3): 2 mg via ORAL
  Filled 2019-08-15 (×9): qty 1

## 2019-08-15 MED ORDER — MAGNESIUM HYDROXIDE 400 MG/5ML PO SUSP: 30.0000 mL | Freq: Every day | ORAL | Status: DC | PRN

## 2019-08-15 MED ORDER — ALUM & MAG HYDROXIDE-SIMETH 200-200-20 MG/5ML PO SUSP: 30.0000 mL | ORAL | Status: DC | PRN

## 2019-08-15 NOTE — [facility] Group Notes (Signed)
[facility] Group Notes: (Clinical Social Work)   08/15/2019      Type of Therapy:  Group Therapy   Participation Level:  Did Not Attend - was invited individually by [facility] and chose not to attend.   Cyril Loosen, LCSWA 08/15/2019, 1:39 PM

## 2019-08-15 NOTE — [facility] Suicide Risk Assessment (Signed)
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  World Science writer [facility]). Score between 0-7:  no or low risk or alcohol related problems. Score between 8-15:  moderate risk of alcohol related problems. Score between 16-19:  high risk of alcohol related problems. Score 20 or above:  warrants further diagnostic evaluation for alcohol dependence and treatment.   CLINICAL FACTORS:   Alcohol/Substance Abuse/Dependencies  Musculoskeletal: Strength & Muscle Tone: within normal limits Gait & Station: normal Patient leans: N/A  Psychiatric Specialty Exam: Physical Exam  Nursing note and vitals reviewed. Constitutional: He appears well-developed and well-nourished.  Cardiovascular: Normal rate and regular rhythm.    Review of Systems  Constitutional: Negative.   Eyes: Negative.   Respiratory: Negative.   Cardiovascular: Negative.   Endocrine: Negative.   Genitourinary: Negative.   Neurological: Negative.     Blood pressure 111/83, pulse (!) 116, temperature 98.2 F (36.8 C), temperature source Oral, resp. rate 18, height 6\' 1"  (1.854 m), weight 79.4 kg, SpO2 100 %.Body mass index is 23.09 kg/m.  General Appearance: Disheveled  Eye  Contact:  Minimal  Speech:  Normal Rate  Volume:  Decreased  Mood:  Irritable  Affect:  Constricted  Thought Process:  Linear  Orientation:  Full (Time, Place, and Person)  Thought Content:  Delusions and Hallucinations: Visual thought to be recent but denies  Suicidal Thoughts:  No  Homicidal Thoughts:  No  Memory:  Immediate;   Fair Recent;   Fair Remote;   Poor  Judgement:  Poor  Insight:  Lacking  Psychomotor Activity:  Normal  Concentration:  Concentration: Poor and Attention Span: Poor  Recall:  Poor  Fund of Knowledge:  Fair  Language:  Good  Akathisia:  Negative  Handed:  Right  AIMS (if indicated):     Assets:  Physical Health Resilience  ADL's:  Intact  Cognition:  WNL  Sleep:  Number of Hours: 0        COGNITIVE FEATURES THAT CONTRIBUTE TO RISK:  Loss of executive function    SUICIDE RISK:   Minimal: No identifiable suicidal ideation.  Patients presenting with no risk factors but with morbid ruminations; may be classified as minimal risk based on the severity of the depressive symptoms  PLAN OF CARE: For stabilization  I certify that inpatient services furnished can reasonably be expected to improve the patient's condition.   , MD 08/15/2019, 9:21 AM

## 2019-08-15 NOTE — ED Provider Notes (Signed)
He has been accepted at [facility] and is transferred there.   Dione Booze, MD 08/15/19 [phone]

## 2019-08-15 NOTE — ED Notes (Signed)
Called [employer] for transportation to [facility]

## 2019-08-15 NOTE — ED Notes (Signed)
TTS at bedside. 

## 2019-08-15 NOTE — Progress Notes (Signed)
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 Reported that he has had decreased sleep and appetite. Mom reports that he is extremely paranoid. During admission patient was very fidgety and tearful reporting that he feels trapped in his parents home not able to go to the store. Patient denied drug and alcohol use. He has various tattoos on his legs and arms, right leg has 2 circular spots on his tattoo that he reports if he held a magnifying glass over one spot the other spot would have fluids come out. Writer explained admission process and he was oriented to unit. Safety maintained with 15 min checks.

## 2019-08-15 NOTE — Progress Notes (Signed)
   08/15/19 1232  Psych Admission Type (Psych Patients Only)  Admission Status Involuntary  Psychosocial Assessment  Patient Complaints Anxiety;Substance abuse;Hyperactivity  Eye Contact Glaring  Facial Expression Anxious  Affect Appropriate to circumstance  Speech Logical/coherent;Pressured  Interaction Assertive  Motor Activity Fidgety;Restless  Appearance/Hygiene In scrubs  Behavior Characteristics Cooperative  Mood Anxious;Angry  Thought Process  Coherency Tangential  Content Blaming others  Delusions Paranoid  Perception WDL  Hallucination None reported or observed  Judgment Impaired  Confusion Mild  Danger to Self  Current suicidal ideation? Denies  Danger to Others  Danger to Others None reported or observed   Pt slept for majority of this morning due to early arrival time on the unit. A & O X 3. Denies SI, HI, AVH and pain when assessed. Reports events leading to admission as "my parents just over reacting, my dad controls everyone in the house, I work with him too and I just feel smothered all the time since I moved back in with them, I'm fucking 29 years old and I can't even go to the damn store. I regret moving back there, I'm going to move in with my sister in [place]". Tearful, with pressured speech when speaking of relationship with family "they are holding me on my history with the drug use and everything but I am working past my history and moving forward". Remains safe on unit. Encouraged to voice concerns and attend groups.

## 2019-08-15 NOTE — [facility] Group Notes (Signed)
Adult Psychoeducational Group Note  Date:  08/15/2019 Time:  10:50 AM  Group Topic/Focus:  Goals Group:   The focus of this group is to help patients establish daily goals to achieve during treatment and discuss how the patient can incorporate goal setting into their daily lives to aide in recovery.  Participation Level:  Did Not Attend   Dione Housekeeper 08/15/2019, 10:50 AM

## 2019-08-15 NOTE — Progress Notes (Signed)
   08/15/19 2015  COVID-19 Daily Checkoff  Have you had a fever (temp > 37.80C/100F)  in the past 24 hours?  No  If you have had runny nose, nasal congestion, sneezing in the past 24 hours, has it worsened? No  COVID-19 EXPOSURE  Have you traveled outside the state in the past 14 days? No  Have you been in contact with someone with a confirmed diagnosis of COVID-19 or PUI in the past 14 days without wearing appropriate PPE? No  Have you been living in the same home as a person with confirmed diagnosis of COVID-19 or a PUI (household contact)? No  Have you been diagnosed with COVID-19? No

## 2019-08-15 NOTE — H&P (Addendum)
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  Patient has an extensive history of polysubstance abuse, cannabis dependency, and approximately 3 years ago confessed to his parents that he was dependent on opiates, had abused some cocaine, and at that point in time was using cannabis daily.  It was at that point that he first began displaying paranoia-that his mother initially felt it was due to multiple stressors combined with drug use.  He lost his job, his car his girlfriend support had a cascading of negative events due to his chemical dependency issue but remained without treatment until this presentation.  The patient presents with a drug screen positive for amphetamines stating he had taken someone's Adderall however he has had a cluster of psychotic symptoms to include insomnia for at least a week, possibly 2 weeks, intense paranoia, going in the yard with a spotlight believing someone was trying to break he had, and bizarre delusions, at one point insisting there was metal under his skin in his leg and at another point telling his mother he could see parasites coming out of the skin.  Further he would watch videos for hours at a time, exactly the same video for hours at a time, believes his dogs have been "fused together" when they were simply lying next which other, believe there were people in the woods trying to come in the house, nailed his bed to the floor nailed his closet shot and put cameras in his room due to his paranoia.  He believes people have been coming into his home/room stealing things breaking  things so forth When asked about the statement that there was metal under the scan of his leg he states "that again was taken out of context that another thing, all I did was rub metal on my skin"  The patient himself denies all of the symptoms and minimizes everything, he does acknowledge vague paranoia saying "you say things when you are being manipulated and controlled" but then states he is making a general statement and no one is controlling him.  When asked about the metal or magnets under his leg he states "that was taken out of context" and every time I bring up one of the delusional statements he again insists that people misunderstood him and becomes little irritated with questions of the mental status exam.  He states he is "never hallucinated"  According to the assessment team  Jerome Lewis is an 29 y.o. single male who presents unaccompanied to [facility] via law enforcement after being petitioned for involuntary commitment by his mother, Dawsyn Ramsaran [phone]. Affidavit and petition states: "Respondent has been a cocaine and pain pill user in the past but tells parents he has not been using. Respondent has had trouble sleeping and has not been eating on a regular basis. Respondent sees things that are not there and is always in a paranoid state of mind. Respondent says that he will end his life but does not have a plan. He believes people are out to get him and believes that his father is trying to harm him along with imaginative people."  Pt reports he was having "a bad day" and  has been experiencing leg spasms. Pt states his right leg is magnetic. 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 He says he feels tense and agitated and that "the walls are closing in on me." He says he is stressed because "I over think things." He denies auditory or visual hallucinations but then states very specific things he doesn't see, e.g. "I  don't see blue whomp monsters in the trees." Pt acknowledges symptoms including crying spells, social withdrawal, loss of interest in usual pleasures, irritability, decreased sleep, decreased appetite and feelings of guilt, worthlessness and hopelessness. He denies current suicidal ideation or history of suicide attempts. He denies current homicidal ideation or history of aggression.   Pt reports he has a history of using substances including pain pills, cocaine and marijuana. He states he stopped using substances three years ago. He reports he took one tab of Adderall two days ago because he wanted to feel productive but told the EDP he took Adderall this evening in an attempt to become intoxicated. Pt's urine drug screens is positive for amphetamines.  Pt reports his relationship with his girlfriend ended and he moved in with his parents. 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 He says he doesn't feel he has anyone in his life who is supportive. He reports he has a charge pending for misuse of a motor vehicle. He states his parents have guns in the home but they are locked. He denies any history of inpatient or outpatient mental health treatment.  TTS attempted to contact Pt's mother/petitioner for collateral information but call went to unidentified voicemail.   Pt is dressed in hospital scrubs, alert and oriented x4. Pt speaks in a clear tone, at moderate volume and normal pace. Motor behavior appears normal. Eye contact is good. Pt's mood is mildly anxious and affect is congruent with mood. Thought process is coherent and relevant. Pt insists that someone put magnets in his leg.     Associated Signs/Symptoms: Depression Symptoms:  insomnia, anxiety, (Hypo) Manic Symptoms:  Delusions, Anxiety Symptoms:  Panic Symptoms, Psychotic Symptoms:  Delusions, Hallucinations: Visual PTSD Symptoms: NA Total Time spent with patient: 45 minutes  Past Psychiatric History:   Is the  patient at risk to self? Yes.    Has the patient been a risk to self in the past 6 months? No.  Has the patient been a risk to self within the distant past? Yes.    Is the patient a risk to others? No.  Has the patient been a risk to others in the past 6 months? No.  Has the patient been a risk to others within the distant past? No.   Prior Inpatient Therapy:  neg Prior Outpatient Therapy:  neg  Alcohol Screening: Patient refused Alcohol Screening Tool: Yes 1. How often do you have a drink containing alcohol?: Never 2. How many drinks containing alcohol do you have on a typical day when you are drinking?: 1 or 2 3. How often do you have six or more drinks on one occasion?: Never AUDIT-C Score: 0 4. How often during the last year have you found that you were not able to stop drinking once you had started?: Never 5. How often during the last year have you failed to do what was normally expected from you becasue of drinking?: Never 9. Have you or someone else been injured as a result of your drinking?: No 10. Has a  relative or friend or a doctor or another health worker been concerned about your drinking or suggested you cut down?: No Alcohol Use Disorder Identification Test Final Score (AUDIT): 0 Alcohol Brief Interventions/Follow-up: Patient Refused Substance Abuse History in the last 12 months:  Yes.   Consequences of Substance Abuse: Medical Consequences:  induction of psychosis Previous Psychotropic Medications: No  Psychological Evaluations: No  Past Medical History: History reviewed. No pertinent past medical history. History reviewed. No pertinent surgical history. Family History: History reviewed. No pertinent family history. Family Psychiatric  History: neg Tobacco Screening: Have you used any form of tobacco in the last 30 days? (Cigarettes, Smokeless Tobacco, Cigars, and/or Pipes): Yes Tobacco use, Select all that apply: 5 or more cigarettes per day Are you interested in  Tobacco Cessation Medications?: No, patient refused Counseled patient on smoking cessation including recognizing danger situations, developing coping skills and basic information about quitting provided: Refused/Declined practical counseling Social History:  Social History   Substance and Sexual Activity  Alcohol Use Yes     Social History   Substance and Sexual Activity  Drug Use Yes  . Types: Marijuana       Allergies:  No Known Allergies Lab Results:  Results for orders placed or performed during the hospital encounter of 08/14/19 (from the past 48 hour(s))  Rapid urine drug screen (hospital performed)     Status: Abnormal   Collection Time: 08/14/19  8:39 PM  Result Value Ref Range   Opiates NONE DETECTED NONE DETECTED   Cocaine NONE DETECTED NONE DETECTED   Benzodiazepines NONE DETECTED NONE DETECTED   Amphetamines POSITIVE (A) NONE DETECTED   Tetrahydrocannabinol NONE DETECTED NONE DETECTED   Barbiturates NONE DETECTED NONE DETECTED    Comment: (NOTE) DRUG SCREEN FOR MEDICAL PURPOSES ONLY.  IF CONFIRMATION IS NEEDED FOR ANY PURPOSE, NOTIFY LAB WITHIN 5 DAYS. LOWEST DETECTABLE LIMITS FOR URINE DRUG SCREEN Drug Class                     Cutoff (ng/mL) Amphetamine and metabolites    1000 Barbiturate and metabolites    200 Benzodiazepine                 200 Tricyclics and metabolites     300 Opiates and metabolites        300 Cocaine and metabolites        300 THC                            50 Performed at [facility], [address] [place], [place] [address]   Comprehensive metabolic panel     Status: None   Collection Time: 08/14/19  8:54 PM  Result Value Ref Range   Sodium 138 135 - 145 mmol/L   Potassium 4.4 3.5 - 5.1 mmol/L   Chloride 102 98 - 111 mmol/L   CO2 27 22 - 32 mmol/L   Glucose, Bld 91 70 - 99 mg/dL    Comment: Glucose reference range applies only to samples taken after fasting for at least 8 hours.   BUN 12 6 - 20 mg/dL   Creatinine,  Ser 1.93 0.61 - 1.24 mg/dL   Calcium 9.5 8.9 - 79.0 mg/dL   Total Protein 7.6 6.5 - 8.1 g/dL   Albumin 4.5 3.5 - 5.0 g/dL   AST 19 15 - 41 U/L   ALT 24 0 - 44 U/L   Alkaline Phosphatase  65 38 - 126 U/L   Total Bilirubin 0.3 0.3 - 1.2 mg/dL   GFR calc non Af Amer >60 >60 mL/min   GFR calc Af Amer >60 >60 mL/min   Anion gap 9 5 - 15    Comment: Performed at [facility], [address] [place], [place] [address]  Ethanol     Status: None   Collection Time: 08/14/19  8:54 PM  Result Value Ref Range   Alcohol, Ethyl (B) <10 <10 mg/dL    Comment: (NOTE) Lowest detectable limit for serum alcohol is 10 mg/dL. For medical purposes only. Performed at [facility], [address] [place], [place] [address]   cbc     Status: None   Collection Time: 08/14/19  8:54 PM  Result Value Ref Range   WBC 8.5 4.0 - 10.5 K/uL   RBC 5.47 4.22 - 5.81 MIL/uL   Hemoglobin 15.6 13.0 - 17.0 g/dL   HCT 17.7 93.9 - 03.0 %   MCV 86.5 80.0 - 100.0 fL   MCH 28.5 26.0 - 34.0 pg   MCHC 33.0 30.0 - 36.0 g/dL   RDW 09.2 33.0 - 07.6 %   Platelets 292 150 - 400 K/uL   nRBC 0.0 0.0 - 0.2 %    Comment: Performed at [facility], [address], [place], [place] [address]  Respiratory Panel by RT PCR (Flu A&B, Covid) - Nasopharyngeal Swab     Status: None   Collection Time: 08/14/19 10:51 PM   Specimen: Nasopharyngeal Swab  Result Value Ref Range   SARS Coronavirus 2 by RT PCR NEGATIVE NEGATIVE    Comment: (NOTE) SARS-CoV-2 target nucleic acids are NOT DETECTED. The SARS-CoV-2 RNA is generally detectable in upper respiratoy specimens during the acute phase of infection. The lowest concentration of SARS-CoV-2 viral copies this assay can detect is 131 copies/mL. A negative result does not preclude SARS-Cov-2 infection and should not be used as the sole basis for treatment or other patient management decisions. A negative result may occur with  improper specimen collection/handling,  submission of specimen other than nasopharyngeal swab, presence of viral mutation(s) within the areas targeted by this assay, and inadequate number of viral copies (<131 copies/mL). A negative result must be combined with clinical observations, patient history, and epidemiological information. The expected result is Negative. Fact Sheet for Patients:  https://www.moore.com/ Fact Sheet for Healthcare Providers:  https://www.young.biz/ This test is not yet ap proved or cleared by the [place] FDA and  has been authorized for detection and/or diagnosis of SARS-CoV-2 by FDA under an Emergency Use Authorization (EUA). This EUA will remain  in effect (meaning this test can be used) for the duration of the COVID-19 declaration under Section 564(b)(1) of the Act, 21 U.S.C. section 360bbb-3(b)(1), unless the authorization is terminated or revoked sooner.    Influenza A by PCR NEGATIVE NEGATIVE   Influenza B by PCR NEGATIVE NEGATIVE    Comment: (NOTE) The Xpert Xpress SARS-CoV-2/FLU/RSV assay is intended as an aid in  the diagnosis of influenza from Nasopharyngeal swab specimens and  should not be used as a sole basis for treatment. Nasal washings and  aspirates are unacceptable for Xpert Xpress SARS-CoV-2/FLU/RSV  testing. Fact Sheet for Patients: https://www.moore.com/ Fact Sheet for Healthcare Providers: https://www.young.biz/ This test is not yet approved or cleared by the [place] FDA and  has been authorized for detection and/or diagnosis of SARS-CoV-2 by  FDA under an Emergency Use Authorization (EUA). This  EUA will remain  in effect (meaning this test can be used) for the duration of the  Covid-19 declaration under Section 564(b)(1) of the Act, 21  U.S.C. section 360bbb-3(b)(1), unless the authorization is  terminated or revoked. Performed at [facility], [address] [place],  [place] [address]     Blood Alcohol level:  Lab Results  Component Value Date   ETH <10 08/14/2019    Metabolic Disorder Labs:  No results found for: HGBA1C, MPG No results found for: PROLACTIN No results found for: CHOL, TRIG, HDL, CHOLHDL, VLDL, LDLCALC  Current Medications: No current facility-administered medications for this encounter.   PTA Medications: Medications Prior to Admission  Medication Sig Dispense Refill Last Dose  . meclizine (ANTIVERT) 50 MG tablet Take 0.5 tablets (25 mg total) by mouth 3 (three) times daily as needed for dizziness. (Patient not taking: Reported on 08/14/2019) 30 tablet 0 Unknown at Unknown time  . ondansetron (ZOFRAN) 4 MG tablet Take 1 tablet (4 mg total) by mouth every 8 (eight) hours as needed for nausea or vomiting. (Patient not taking: Reported on 08/14/2019) 12 tablet 0 Unknown at Unknown time    Musculoskeletal: Strength & Muscle Tone: within normal limits Gait & Station: normal Patient leans: N/A  Psychiatric Specialty Exam: Physical Exam  Nursing note and vitals reviewed. Constitutional: He appears well-developed and well-nourished.  Cardiovascular: Normal rate and regular rhythm.    Review of Systems  Constitutional: Negative.   Eyes: Negative.   Respiratory: Negative.   Cardiovascular: Negative.   Endocrine: Negative.   Genitourinary: Negative.   Neurological: Negative.     Blood pressure 111/83, pulse (!) 116, temperature 98.2 F (36.8 C), temperature source Oral, resp. rate 18, height 6\' 1"  (1.854 m), weight 79.4 kg, SpO2 100 %.Body mass index is 23.09 kg/m.  General Appearance: Disheveled  Eye Contact:  Minimal  Speech:  Normal Rate  Volume:  Decreased  Mood:  Irritable  Affect:  Constricted  Thought Process:  Linear  Orientation:  Full (Time, Place, and Person)  Thought Content:  Delusions and Hallucinations: Visual thought to be recent but denies  Suicidal Thoughts:  No  Homicidal Thoughts:  No  Memory:  Immediate;    Fair Recent;   Fair Remote;   Poor  Judgement:  Poor  Insight:  Lacking  Psychomotor Activity:  Normal  Concentration:  Concentration: Poor and Attention Span: Poor  Recall:  Poor  Fund of Knowledge:  Fair  Language:  Good  Akathisia:  Negative  Handed:  Right  AIMS (if indicated):     Assets:  Physical Health Resilience  ADL's:  Intact  Cognition:  WNL  Sleep:  Number of Hours: 0    Treatment Plan Summary: Daily contact with patient to assess and evaluate symptoms and progress in treatment and Medication management  Observation Level/Precautions:  15 minute checks  Laboratory:  UDS  Psychotherapy: Reality based  Medications: Begin antipsychotic therapy and neuro protective measures  Consultations:    Discharge Concerns: Diagnostic clarity/abstinence from drugs  Estimated LOS: 5-7  Other:     Physician Treatment Plan for Primary Diagnosis:   Axis I-new onset psychosis, rule out schizophreniform disorder, rule out substance-induced psychosis  History of cannabis dependency History of opiate dependency Amphetamine abuse  Axis II deferred  Axis III no medical findings  Plan  Mother would like to be contacted daily, her number is [phone] Begin antipsychotics monitor for withdrawal begin neuro protective measures  Long Term Goal(s): Improvement in  symptoms so as ready for discharge  Short Term Goals: Ability to identify changes in lifestyle to reduce recurrence of condition will improve, Ability to verbalize feelings will improve, Ability to disclose and discuss suicidal ideas and Ability to identify and develop effective coping behaviors will improve  Physician Treatment Plan for Secondary Diagnosis: Active Problems:   * No active hospital problems. *  Long Term Goal(s): Improvement in symptoms so as ready for discharge  Short Term Goals: Ability to identify changes in lifestyle to reduce recurrence of condition will improve, Ability to verbalize feelings will  improve, Ability to disclose and discuss suicidal ideas, Ability to demonstrate self-control will improve, Ability to identify and develop effective coping behaviors will improve and Compliance with prescribed medications will improve  I certify that inpatient services furnished can reasonably be expected to improve the patient's condition.    Malvin JohnsFARAH,Edmundo Tedesco, MD 3/6/20219:01 AM

## 2019-08-15 NOTE — BH Assessment (Signed)
Tele Assessment Note   Patient Name: Jerome Lewis MRN: [id number] Referring Physician: Lorre Nick, MD Location of Patient: [facility], [facility] Location of Provider: Behavioral Health TTS Department  Jerome Lewis is an 29 y.o. single male who presents unaccompanied to [facility] ED via law enforcement after being petitioned for involuntary commitment by his mother, Yonathan Perrow [phone]. Affidavit and petition states: "Respondent has been a cocaine and pain pill user in the past but tells parents he has not been using. Respondent has had trouble sleeping and has not been eating on a regular basis. Respondent sees things that are not there and is always in a paranoid state of mind. Respondent says that he will end his life but does not have a plan. He believes people are out to get him and believes that his father is trying to harm him along with imaginative people."  Pt reports he was having "a bad day" and has been experiencing leg spasms. Pt states his right leg is magnetic. 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 He says he feels tense and agitated and that "the walls are closing in on me." He says he is stressed because "I over think things." He denies auditory or visual hallucinations but then states very specific things he doesn't see, e.g. "I don't see blue whomp monsters in the trees." Pt acknowledges symptoms including crying spells, social withdrawal, loss of interest in usual pleasures, irritability, decreased sleep, decreased appetite and feelings of guilt, worthlessness and hopelessness. He denies current suicidal ideation or history of suicide attempts. He denies current homicidal ideation or history of aggression.   Pt reports he has a history of using substances including pain pills, cocaine and marijuana. He states he stopped using substances three years ago. He reports he took one tab of Adderall two days ago because  he wanted to feel productive but told the EDP he took Adderall this evening in an attempt to become intoxicated. Pt's urine drug screens is positive for amphetamines.  Pt reports his relationship with his girlfriend ended and he moved in with his parents. 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 He says he doesn't feel he has anyone in his life who is supportive. He reports he has a charge pending for misuse of a motor vehicle. He states his parents have guns in the home but they are locked. He denies any history of inpatient or outpatient mental health treatment.  TTS attempted to contact Pt's mother/petitioner for collateral information but call went to unidentified voicemail.   Pt is dressed in hospital scrubs, alert and oriented x4. Pt speaks in a clear tone, at moderate volume and normal pace. Motor behavior appears normal. Eye contact is good. Pt's mood is mildly anxious and affect is congruent with mood. Thought process is coherent and relevant. Pt insists that someone put magnets in his leg.    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 No pertinent past medical history.  History reviewed. No pertinent surgical history.  Family History: No family history on file.  Social History:  reports that he has been smoking. He has never used smokeless tobacco. He reports current alcohol use. He reports current drug use. Drug: Marijuana.  Additional Social History:  Alcohol / Drug Use Pain Medications: History of abusing pain medications Prescriptions: History of abusing prescription medications Over  the Counter: Denies abuse History of alcohol / drug use?: Yes Longest period of sobriety (when/how long): Pt reports no use in three years  CIWA: CIWA-Ar BP: 138/84 Pulse Rate: 74 COWS:    Allergies: No Known Allergies  Home Medications: (Not in a hospital admission)   OB/GYN Status:   No LMP for male patient.  General Assessment Data Location of Assessment: [facility] ED TTS Assessment: In system Is this a Tele or Face-to-Face Assessment?: Tele Assessment Is this an Initial Assessment or a Re-assessment for this encounter?: Initial Assessment Patient Accompanied by:: N/A Language Other than English: No Living Arrangements: Other (Comment)(Lives with parents) What gender do you identify as?: Male Marital status: Single Maiden name: NA Pregnancy Status: No Living Arrangements: Parent Can pt return to current living arrangement?: Yes Admission Status: Involuntary Petitioner: Family member Is patient capable of signing voluntary admission?: Yes Referral Source: Self/Family/Friend Insurance type: Self-pay     Crisis Care Plan Living Arrangements: Parent Legal Guardian: Other:(Self) Name of Psychiatrist: None Name of Therapist: None  Education Status Is patient currently in school?: No Is the patient employed, unemployed or receiving disability?: Employed  Risk to self with the past 6 months Suicidal Ideation: No Has patient been a risk to self within the past 6 months prior to admission? : No Suicidal Intent: No Has patient had any suicidal intent within the past 6 months prior to admission? : No Is patient at risk for suicide?: No Suicidal Plan?: No Has patient had any suicidal plan within the past 6 months prior to admission? : No Access to Means: No What has been your use of drugs/alcohol within the last 12 months?: Pt has history of using various drugs Previous Attempts/Gestures: No How many times?: 0 Other Self Harm Risks: None Triggers for Past Attempts: None known Intentional Self Injurious Behavior: None Family Suicide History: No Recent stressful life event(s): Loss (Comment)(Relationship ended) Persecutory voices/beliefs?: Yes Depression: Yes Depression Symptoms: Despondent, Insomnia, Tearfulness, Isolating, Fatigue, Guilt, Loss of interest in  usual pleasures, Feeling worthless/self pity, Feeling angry/irritable Substance abuse history and/or treatment for substance abuse?: Yes Suicide prevention information given to non-admitted patients: Not applicable  Risk to Others within the past 6 months Homicidal Ideation: No Does patient have any lifetime risk of violence toward others beyond the six months prior to admission? : No Thoughts of Harm to Others: No Current Homicidal Intent: No Current Homicidal Plan: No Access to Homicidal Means: No Identified Victim: None History of harm to others?: No Assessment of Violence: None Noted Violent Behavior Description: Pt denies history of violence Does patient have access to weapons?: Yes (Comment)(Pt reports guns in the home) Criminal Charges Pending?: Yes Describe Pending Criminal Charges: Misuse of motor vehicle Does patient have a court date: No Is patient on probation?: No  Psychosis Hallucinations: Tactile, Visual Delusions: Persecutory  Mental Status Report Appearance/Hygiene: In scrubs Eye Contact: Fair Motor Activity: Freedom of movement Speech: Logical/coherent Level of Consciousness: Alert Mood: Anxious Affect: Appropriate to circumstance Anxiety Level: Minimal Thought Processes: Coherent, Relevant Judgement: Impaired Orientation: Person, Place, Time, Situation Obsessive Compulsive Thoughts/Behaviors: None  Cognitive Functioning Concentration: Normal Memory: Recent Intact, Remote Intact Is patient IDD: No Insight: Poor Impulse Control: Fair Appetite: Poor Have you had any weight changes? : Loss Amount of the weight change? (lbs): 75 lbs Sleep: Decreased Total Hours of Sleep: 3 Vegetative Symptoms: None  ADLScreening [facility] Assessment Services) Patient's cognitive ability adequate to safely complete daily activities?: Yes Patient able to express need for assistance with  ADLs?: Yes Independently performs ADLs?: Yes (appropriate for developmental  age)  Prior Inpatient Therapy Prior Inpatient Therapy: No  Prior Outpatient Therapy Prior Outpatient Therapy: No Does patient have an ACCT team?: No Does patient have Intensive In-House Services?  : No Does patient have [facility] services? : No Does patient have P4CC services?: No  ADL Screening (condition at time of admission) Patient's cognitive ability adequate to safely complete daily activities?: Yes Is the patient deaf or have difficulty hearing?: No Does the patient have difficulty seeing, even when wearing glasses/contacts?: No Does the patient have difficulty concentrating, remembering, or making decisions?: No Patient able to express need for assistance with ADLs?: Yes Does the patient have difficulty dressing or bathing?: No Independently performs ADLs?: Yes (appropriate for developmental age) Does the patient have difficulty walking or climbing stairs?: No Weakness of Legs: None Weakness of Arms/Hands: None  Home Assistive Devices/Equipment Home Assistive Devices/Equipment: None    Abuse/Neglect Assessment (Assessment to be complete while patient is alone) Abuse/Neglect Assessment Can Be Completed: Yes Physical Abuse: Denies Verbal Abuse: Denies Sexual Abuse: Denies Exploitation of patient/patient's resources: Denies Self-Neglect: Denies     Regulatory affairs officer (For Healthcare) Does Patient Have a Medical Advance Directive?: No Would patient like information on creating a medical advance directive?: No - Patient declined          Disposition: Lavell Luster, [facility] at [facility], confirmed bed availability. Gave clinical report to Lindon Romp, FNP who said Pt meets criteria for inpatient psychiatric treatment. Pt is accepted to the service of Dr. Johnn Hai, room 503-1. Notified Dr. Delora Fuel and Binnie Rail, RN of acceptance.  Disposition Initial Assessment Completed for this Encounter: Yes  This service was provided via telemedicine using a 2-way,  interactive audio and video technology.  Names of all persons participating in this telemedicine service and their role in this encounter. Name: Laurena Slimmer Role: Patient     Name: Storm Frisk, [facility] Role: TTS counselor      Orpah Greek Anson Fret, [facility], [facility] Triage Specialist [phone]  Evelena Peat 08/15/2019 1:45 AM

## 2019-08-15 NOTE — ED Notes (Signed)
Pt transported via [facility] deputy to [facility]. Pt personal belongings from locker(clothing) and from security locker (including cell phone, drivers license, rewards card, 330 dollars and 2 dollars rolled up) was given in a sealed envelope to the [employer] for transport to [facility].

## 2019-08-16 DIAGNOSIS — F2081 Schizophreniform disorder: Principal | ICD-10-CM

## 2019-08-16 LAB — LIPID PANEL
Cholesterol: 178 mg/dL (ref 0–200)
HDL: 42 mg/dL (ref >40)
LDL Cholesterol: 93 mg/dL (ref 0–99)
Total CHOL/HDL Ratio: 4.2 RATIO
Triglycerides: 213 mg/dL — ABNORMAL HIGH (ref <150)
VLDL: 43 mg/dL — ABNORMAL HIGH (ref 0–40)

## 2019-08-16 LAB — TSH: TSH: 1.419 u[IU]/mL (ref 0.350–4.500)

## 2019-08-16 NOTE — Plan of Care (Signed)
  Problem: Education: Goal: Knowledge of disease or condition will improve Outcome: Not Progressing Goal: Understanding of discharge needs will improve Outcome: Not Progressing   Problem: Health Behavior/Discharge Planning: Goal: Ability to identify changes in lifestyle to reduce recurrence of condition will improve Outcome: Not Progressing Goal: Identification of resources available to assist in meeting health care needs will improve Outcome: Not Progressing   

## 2019-08-16 NOTE — Progress Notes (Signed)
Adult Psychoeducational Group Note  Date:  08/16/2019 Time:  10:14 PM  Group Topic/Focus:  Wrap-Up Group:   The focus of this group is to help patients review their daily goal of treatment and discuss progress on daily workbooks.  Participation Level:  Did Not Attend  Participation Quality:  Did Not Attend  Affect:  Did Not Attend  Cognitive:  Did Not Attend  Insight: None  Engagement in Group:  Did Not Attend  Modes of Intervention:  Did Not Attend  Additional Comments:  Pt did not attend evening wrap up group tonight.  Felipa Furnace 08/16/2019, 10:14 PM

## 2019-08-16 NOTE — BHH Group Notes (Signed)
Adult Psychoeducational Group Note  Date:  08/16/2019 Time:  7:01 PM  Group Topic/Focus:  Progressive Relaxation:  Learning how to deep breathe and to tighten and relax muscles in the bocy. Also used is Imagery of being on a beach  Participation Level:  Did Not Attend   Dione Housekeeper 08/16/2019, 7:01 PM

## 2019-08-16 NOTE — Progress Notes (Signed)
Progress note  D: pt found in bed; noncompliant with morning medication pass. Pt refused to respond to this Clinical research associate. Pt allowed to rest. Pt told NP that they felt the medication was "too much". Pt stated they would come for lunch medication but failed to do so after contact twice. Pt has slept most of the day. Pt denies any physical pain or symptoms. Pt denies si/hi/ah/vh and verbally agrees to approach staff if these become apparent or before harming themself/others while at bhh.  A: Pt provided support and encouragement. Pt given medication per protocol and standing orders. Q31m safety checks implemented and continued.  R: Pt safe on the unit. Will continue to monitor.
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
## 2019-08-16 NOTE — Tx Team (Signed)
Initial Treatment Plan 08/16/2019 1:26 AM Melvern Sample IRC:[id number]    PATIENT STRESSORS: Financial difficulties Substance abuse   PATIENT STRENGTHS: Ability for insight Motivation for treatment/growth Supportive family/friends   PATIENT IDENTIFIED PROBLEMS: Substance abuse    Psychosis                 DISCHARGE CRITERIA:  Improved stabilization in mood, thinking, and/or behavior  PRELIMINARY DISCHARGE PLAN: Placement in alternative living arrangements Return to previous living arrangement  PATIENT/FAMILY INVOLVEMENT: This treatment plan has been presented to and reviewed with the patient, SARATH PRIVOTT, and/or family member.  The patient and family have been given the opportunity to ask questions and make suggestions.  Floyce Stakes, RN 08/16/2019, 1:26 AM

## 2019-08-16 NOTE — Progress Notes (Signed)
[facility] MD Progress Note  08/16/2019 9:02 AM Jerome Lewis  MRN:  [id number] Subjective:  "I'm alright."  Jerome Lewis found lying in bed. He reports good sleep overnight but with continuing fatigue. He does admit to problems with sleep before hospitalization but continues to minimize symptoms as listed in prior notes. When asked about paranoia, he states "that was just one time when I saw a car parked on the street at 3am." He continues to state delusional thought content as reported by his mother was "all a misunderstanding" and denies psychotic behaviors. He does report conflict while living with his parents and expresses desire to stay with his sister after discharge. UDS positive for amphetamines. He reports taking someone else's Adderall prior to admission but minimizes drug use. He denies SI/HI/AVH. He becomes irritable when answering questions about symptoms but reports stable mood. He has been isolating to his room. No agitated or disruptive behaviors.       From admission H&P: The patient presents with a drug screen positive for amphetamines stating he had taken someone's Adderall however he has had a cluster of psychotic symptoms to include insomnia for at least a week, possibly 2 weeks, intense paranoia, going in the yard with a spotlight believing someone was trying to break he had, and bizarre delusions, at one point insisting there was metal under his skin in his leg and at another point telling his mother he could see parasites coming out of the skin.   Principal Problem: <principal problem not specified> Diagnosis: Active Problems:   Schizophreniform disorder (HCC)   Psychosis ([place])  Total Time spent with patient: 15 minutes  Past Psychiatric History: See admission H&P  Past Medical History: History reviewed. No pertinent past medical history. History reviewed. No pertinent surgical history. Family History: History reviewed. No pertinent family history. Family Psychiatric  History: See  admission H&P Social History:  Social History   Substance and Sexual Activity  Alcohol Use Yes     Social History   Substance and Sexual Activity  Drug Use Yes  . Types: Marijuana    Social History   Socioeconomic History  . Marital status: Single    Spouse name: Not on file  . Number of children: Not on file  . Years of education: Not on file  . Highest education level: Not on file  Occupational History  . Not on file  Tobacco Use  . Smoking status: Current Some Day Smoker  . Smokeless tobacco: Never Used  Substance and Sexual Activity  . Alcohol use: Yes  . Drug use: Yes    Types: Marijuana  . Sexual activity: Not on file  Other Topics Concern  . Not on file  Social History Narrative  . Not on file   Social Determinants of Health   Financial Resource Strain:   . Difficulty of Paying Living Expenses: Not on file  Food Insecurity:   . Worried About Charity fundraiser in the Last Year: Not on file  . Ran Out of Food in the Last Year: Not on file  Transportation Needs:   . Lack of Transportation (Medical): Not on file  . Lack of Transportation (Non-Medical): Not on file  Physical Activity:   . Days of Exercise per Week: Not on file  . Minutes of Exercise per Session: Not on file  Stress:   . Feeling of Stress : Not on file  Social Connections:   . Frequency of Communication with Friends and Family: Not on file  .  Frequency of Social Gatherings with Friends and Family: Not on file  . Attends Religious Services: Not on file  . Active Member of Clubs or Organizations: Not on file  . Attends Banker Meetings: Not on file  . Marital Status: Not on file   Additional Social History:                         Sleep: Good  Appetite:  Good  Current Medications: Current Facility-Administered Medications  Medication Dose Route Frequency Provider Last Rate Last Admin  . acetaminophen (TYLENOL) tablet 650 mg  650 mg Oral Q6H PRN Jackelyn Poling, NP      . alum & mag hydroxide-simeth (MAALOX/MYLANTA) 200-200-20 MG/5ML suspension 30 mL  30 mL Oral Q4H PRN Nira Conn A, NP      . hydrOXYzine (ATARAX/VISTARIL) tablet 25 mg  25 mg Oral TID PRN Nira Conn A, NP      . magnesium hydroxide (MILK OF MAGNESIA) suspension 30 mL  30 mL Oral Daily PRN Nira Conn A, NP      . omega-3 acid ethyl esters (LOVAZA) capsule 1 g  1 g Oral BID Malvin Johns, MD      . prenatal multivitamin tablet 1 tablet  1 tablet Oral Q1200 Malvin Johns, MD      . risperiDONE (RISPERDAL) tablet 2 mg  2 mg Oral BID Malvin Johns, MD   2 mg at 08/15/19 1800  . temazepam (RESTORIL) capsule 15 mg  15 mg Oral QHS PRN Nira Conn A, NP      . temazepam (RESTORIL) capsule 30 mg  30 mg Oral QHS Malvin Johns, MD   30 mg at 08/15/19 2134    Lab Results:  Results for orders placed or performed during the hospital encounter of 08/14/19 (from the past 48 hour(s))  Rapid urine drug screen (hospital performed)     Status: Abnormal   Collection Time: 08/14/19  8:39 PM  Result Value Ref Range   Opiates NONE DETECTED NONE DETECTED   Cocaine NONE DETECTED NONE DETECTED   Benzodiazepines NONE DETECTED NONE DETECTED   Amphetamines POSITIVE (A) NONE DETECTED   Tetrahydrocannabinol NONE DETECTED NONE DETECTED   Barbiturates NONE DETECTED NONE DETECTED    Comment: (NOTE) DRUG SCREEN FOR MEDICAL PURPOSES ONLY.  IF CONFIRMATION IS NEEDED FOR ANY PURPOSE, NOTIFY LAB WITHIN 5 DAYS. LOWEST DETECTABLE LIMITS FOR URINE DRUG SCREEN Drug Class                     Cutoff (ng/mL) Amphetamine and metabolites    1000 Barbiturate and metabolites    200 Benzodiazepine                 200 Tricyclics and metabolites     300 Opiates and metabolites        300 Cocaine and metabolites        300 THC                            50 Performed at [facility], [address], [place], [place] [address]   Comprehensive metabolic panel     Status: None   Collection Time: 08/14/19  8:54 PM   Result Value Ref Range   Sodium 138 135 - 145 mmol/L   Potassium 4.4 3.5 - 5.1 mmol/L   Chloride 102 98 - 111 mmol/L   CO2 27  22 - 32 mmol/L   Glucose, Bld 91 70 - 99 mg/dL    Comment: Glucose reference range applies only to samples taken after fasting for at least 8 hours.   BUN 12 6 - 20 mg/dL   Creatinine, Ser 2.44 0.61 - 1.24 mg/dL   Calcium 9.5 8.9 - 01.0 mg/dL   Total Protein 7.6 6.5 - 8.1 g/dL   Albumin 4.5 3.5 - 5.0 g/dL   AST 19 15 - 41 U/L   ALT 24 0 - 44 U/L   Alkaline Phosphatase 65 38 - 126 U/L   Total Bilirubin 0.3 0.3 - 1.2 mg/dL   GFR calc non Af Amer >60 >60 mL/min   GFR calc Af Amer >60 >60 mL/min   Anion gap 9 5 - 15    Comment: Performed at [facility], [address], [place], [place] [address]  Ethanol     Status: None   Collection Time: 08/14/19  8:54 PM  Result Value Ref Range   Alcohol, Ethyl (B) <10 <10 mg/dL    Comment: (NOTE) Lowest detectable limit for serum alcohol is 10 mg/dL. For medical purposes only. Performed at [facility], [address] [place], [place] [address]   cbc     Status: None   Collection Time: 08/14/19  8:54 PM  Result Value Ref Range   WBC 8.5 4.0 - 10.5 K/uL   RBC 5.47 4.22 - 5.81 MIL/uL   Hemoglobin 15.6 13.0 - 17.0 g/dL   HCT 34.7 42.5 - 95.6 %   MCV 86.5 80.0 - 100.0 fL   MCH 28.5 26.0 - 34.0 pg   MCHC 33.0 30.0 - 36.0 g/dL   RDW 38.7 56.4 - 33.2 %   Platelets 292 150 - 400 K/uL   nRBC 0.0 0.0 - 0.2 %    Comment: Performed at [facility], [address] [place], [place] [address]  Respiratory Panel by RT PCR (Flu A&B, Covid) - Nasopharyngeal Swab     Status: None   Collection Time: 08/14/19 10:51 PM   Specimen: Nasopharyngeal Swab  Result Value Ref Range   SARS Coronavirus 2 by RT PCR NEGATIVE NEGATIVE    Comment: (NOTE) SARS-CoV-2 target nucleic acids are NOT DETECTED. The SARS-CoV-2 RNA is generally detectable in upper respiratoy specimens during the acute phase of infection. The  lowest concentration of SARS-CoV-2 viral copies this assay can detect is 131 copies/mL. A negative result does not preclude SARS-Cov-2 infection and should not be used as the sole basis for treatment or other patient management decisions. A negative result may occur with  improper specimen collection/handling, submission of specimen other than nasopharyngeal swab, presence of viral mutation(s) within the areas targeted by this assay, and inadequate number of viral copies (<131 copies/mL). A negative result must be combined with clinical observations, patient history, and epidemiological information. The expected result is Negative. Fact Sheet for Patients:  https://www.moore.com/ Fact Sheet for Healthcare Providers:  https://www.young.biz/ This test is not yet ap proved or cleared by the [place] FDA and  has been authorized for detection and/or diagnosis of SARS-CoV-2 by FDA under an Emergency Use Authorization (EUA). This EUA will remain  in effect (meaning this test can be used) for the duration of the COVID-19 declaration under Section 564(b)(1) of the Act, 21 U.S.C. section 360bbb-3(b)(1), unless the authorization is terminated or revoked sooner.    Influenza A by PCR NEGATIVE NEGATIVE   Influenza B by PCR NEGATIVE NEGATIVE  Comment: (NOTE) The Xpert Xpress SARS-CoV-2/FLU/RSV assay is intended as an aid in  the diagnosis of influenza from Nasopharyngeal swab specimens and  should not be used as a sole basis for treatment. Nasal washings and  aspirates are unacceptable for Xpert Xpress SARS-CoV-2/FLU/RSV  testing. Fact Sheet for Patients: https://www.moore.com/ Fact Sheet for Healthcare Providers: https://www.young.biz/ This test is not yet approved or cleared by the [place] FDA and  has been authorized for detection and/or diagnosis of SARS-CoV-2 by  FDA under an Emergency Use  Authorization (EUA). This EUA will remain  in effect (meaning this test can be used) for the duration of the  Covid-19 declaration under Section 564(b)(1) of the Act, 21  U.S.C. section 360bbb-3(b)(1), unless the authorization is  terminated or revoked. Performed at [facility], [address], [place], [place] [address]     Blood Alcohol level:  Lab Results  Component Value Date   ETH <10 08/14/2019    Metabolic Disorder Labs: No results found for: HGBA1C, MPG No results found for: PROLACTIN No results found for: CHOL, TRIG, HDL, CHOLHDL, VLDL, LDLCALC  Physical Findings: AIMS: Facial and Oral Movements Muscles of Facial Expression: None, normal Lips and Perioral Area: None, normal Jaw: None, normal Tongue: None, normal,Extremity Movements Upper (arms, wrists, hands, fingers): None, normal Lower (legs, knees, ankles, toes): None, normal, Trunk Movements Neck, shoulders, hips: None, normal, Overall Severity Severity of abnormal movements (highest score from questions above): None, normal Incapacitation due to abnormal movements: None, normal Patient's awareness of abnormal movements (rate only patient's report): No Awareness, Dental Status Current problems with teeth and/or dentures?: No Does patient usually wear dentures?: No  CIWA:    COWS:     Musculoskeletal: Strength & Muscle Tone: within normal limits Gait & Station: normal Patient leans: N/A  Psychiatric Specialty Exam: Physical Exam  Nursing note and vitals reviewed. Constitutional: He is oriented to person, place, and time. He appears well-developed and well-nourished.  Respiratory: Effort normal.  Musculoskeletal:        General: Normal range of motion.  Neurological: He is alert and oriented to person, place, and time.    Review of Systems  Constitutional: Negative.   Psychiatric/Behavioral: Negative for agitation, behavioral problems, dysphoric mood, hallucinations, self-injury, sleep  disturbance and suicidal ideas. The patient is not nervous/anxious and is not hyperactive.     Blood pressure 111/83, pulse (!) 116, temperature 98.2 F (36.8 C), temperature source Oral, resp. rate 18, height 6\' 1"  (1.854 m), weight 79.4 kg, SpO2 100 %.Body mass index is 23.09 kg/m.  General Appearance: Disheveled  Eye Contact:  Fair  Speech:  Normal Rate  Volume:  Normal  Mood:  Euthymic  Affect:  Congruent  Thought Process:  Coherent  Orientation:  Full (Time, Place, and Person)  Thought Content:  Logical  Suicidal Thoughts:  No  Homicidal Thoughts:  No  Memory:  Immediate;   Fair Recent;   Fair  Judgement:  Fair  Insight:  Lacking  Psychomotor Activity:  Normal  Concentration:  Concentration: Fair and Attention Span: Fair  Recall:  [employer] of Knowledge:  Fair  Language:  Fair  Akathisia:  No  Handed:  Right  AIMS (if indicated):     Assets:  Communication Skills Housing Resilience Social Support  ADL's:  Intact  Cognition:  WNL  Sleep:  Number of Hours: 6.75     Treatment Plan Summary: Daily contact with patient to assess and evaluate symptoms and progress in treatment and  Medication management  Continue inpatient hospitalization.  Continue Risperdal 2 mg PO BID for psychosis Continue Lovaza 1 g PO BID for neuroprotection Continue Vistaril 25 mg PO TID PRN anxiety Continue Restoril 30 mg PO QHS, 15 mg PRN insomnia  Patient will participate in the therapeutic group milieu.  Discharge disposition in progress.   Aldean Baker, NP 08/16/2019, 9:02 AM

## 2019-08-16 NOTE — [facility] Group Notes (Signed)
[facility] Group Notes: (Clinical Social Work)   08/16/2019      Type of Therapy:  Group Therapy   Participation Level:  Did Not Attend - was invited individually by MHT and chose not to attend.   Cyril Loosen, LCSWA 08/16/2019, 12:09 PM

## 2019-08-17 LAB — HEMOGLOBIN A1C
Hgb A1c MFr Bld: 5.4 % (ref 4.8–5.6)
Mean Plasma Glucose: 108.28 mg/dL

## 2019-08-17 MED ORDER — BOOST / RESOURCE BREEZE PO LIQD CUSTOM
1.0000 | ORAL | Status: DC
  Administered 2019-08-17 – 2019-08-19 (×2): 1 via ORAL
  Filled 2019-08-17 (×4): qty 1

## 2019-08-17 MED ORDER — ENSURE ENLIVE PO LIQD
237.0000 mL | ORAL | Status: DC
  Administered 2019-08-18: 237 mL via ORAL

## 2019-08-17 MED ORDER — RISPERIDONE 1 MG PO TABS
1.0000 mg | ORAL_TABLET | Freq: Two times a day (BID) | ORAL | Status: DC
  Administered 2019-08-17 – 2019-08-19 (×2): 1 mg via ORAL
  Filled 2019-08-17 (×6): qty 1

## 2019-08-17 NOTE — [facility] Group Notes (Signed)
LCSW Group Therapy Notes 08/17/2019 1:48 PM  Type of Therapy and Topic: Group Therapy: Overcoming Obstacles  Participation Level: Did Not Attend  Description of Group:  In this group patients will be encouraged to explore what they see as obstacles to their own wellness and recovery. They will be guided to discuss their thoughts, feelings, and behaviors related to these obstacles. The group will process together ways to cope with barriers, with attention given to specific choices patients can make. Each patient will be challenged to identify changes they are motivated to make in order to overcome their obstacles. This group will be process-oriented, with patients participating in exploration of their own experiences as well as giving and receiving support and challenge from other group members.  Therapeutic Goals: 1. Patient will identify personal and current obstacles as they relate to admission. 2. Patient will identify barriers that currently interfere with their wellness or overcoming obstacles.  3. Patient will identify feelings, thought process and behaviors related to these barriers. 4. Patient will identify two changes they are willing to make to overcome these obstacles:   Summary of Patient Progress Invited, chose not to attend.  Therapeutic Modalities:  Cognitive Behavioral Therapy Solution Focused Therapy Motivational Interviewing Relapse Prevention Therapy  Randall Rampersad, MSW, LCSWA 08/17/2019 1:48 PM  

## 2019-08-17 NOTE — Tx Team (Signed)
Interdisciplinary Treatment and Diagnostic Plan Update  08/17/2019 Time of Session: 9:00am Jerome Lewis MRN: [id number]  Principal Diagnosis: <principal problem not specified>  Secondary Diagnoses: Active Problems:   Schizophreniform disorder ([place])   Psychosis ([place])   Current Medications:  Current Facility-Administered Medications  Medication Dose Route Frequency Provider Last Rate Last Admin  . acetaminophen (TYLENOL) tablet 650 mg  650 mg Oral Q6H PRN Rozetta Nunnery, NP      . alum & mag hydroxide-simeth (MAALOX/MYLANTA) 200-200-20 MG/5ML suspension 30 mL  30 mL Oral Q4H PRN Lindon Romp A, NP      . hydrOXYzine (ATARAX/VISTARIL) tablet 25 mg  25 mg Oral TID PRN Lindon Romp A, NP      . magnesium hydroxide (MILK OF MAGNESIA) suspension 30 mL  30 mL Oral Daily PRN Lindon Romp A, NP      . omega-3 acid ethyl esters (LOVAZA) capsule 1 g  1 g Oral BID Johnn Hai, MD      . prenatal multivitamin tablet 1 tablet  1 tablet Oral Q1200 Johnn Hai, MD      . risperiDONE (RISPERDAL) tablet 2 mg  2 mg Oral BID Johnn Hai, MD   2 mg at 08/17/19 9629  . temazepam (RESTORIL) capsule 15 mg  15 mg Oral QHS PRN Lindon Romp A, NP      . temazepam (RESTORIL) capsule 30 mg  30 mg Oral QHS Johnn Hai, MD   30 mg at 08/15/19 2134   PTA Medications: Medications Prior to Admission  Medication Sig Dispense Refill Last Dose  . meclizine (ANTIVERT) 50 MG tablet Take 0.5 tablets (25 mg total) by mouth 3 (three) times daily as needed for dizziness. (Patient not taking: Reported on 08/14/2019) 30 tablet 0 Unknown at Unknown time  . ondansetron (ZOFRAN) 4 MG tablet Take 1 tablet (4 mg total) by mouth every 8 (eight) hours as needed for nausea or vomiting. (Patient not taking: Reported on 08/14/2019) 12 tablet 0 Unknown at Unknown time    Patient Stressors: Financial difficulties Substance abuse  Patient Strengths: Ability for insight Motivation for treatment/growth Supportive  family/friends  Treatment Modalities: Medication Management, Group therapy, Case management,  1 to 1 session with clinician, Psychoeducation, Recreational therapy.   Physician Treatment Plan for Primary Diagnosis: <principal problem not specified> Long Term Goal(s): Improvement in symptoms so as ready for discharge Improvement in symptoms so as ready for discharge   Short Term Goals: Ability to identify changes in lifestyle to reduce recurrence of condition will improve Ability to verbalize feelings will improve Ability to disclose and discuss suicidal ideas Ability to identify and develop effective coping behaviors will improve Ability to identify changes in lifestyle to reduce recurrence of condition will improve Ability to verbalize feelings will improve Ability to disclose and discuss suicidal ideas Ability to demonstrate self-control will improve Ability to identify and develop effective coping behaviors will improve Compliance with prescribed medications will improve  Medication Management: Evaluate patient's response, side effects, and tolerance of medication regimen.  Therapeutic Interventions: 1 to 1 sessions, Unit Group sessions and Medication administration.  Evaluation of Outcomes: Not Met  Physician Treatment Plan for Secondary Diagnosis: Active Problems:   Schizophreniform disorder ([place])   Psychosis ([place])  Long Term Goal(s): Improvement in symptoms so as ready for discharge Improvement in symptoms so as ready for discharge   Short Term Goals: Ability to identify changes in lifestyle to reduce recurrence of condition will improve Ability to verbalize feelings will improve Ability to disclose  and discuss suicidal ideas Ability to identify and develop effective coping behaviors will improve Ability to identify changes in lifestyle to reduce recurrence of condition will improve Ability to verbalize feelings will improve Ability to disclose and discuss suicidal  ideas Ability to demonstrate self-control will improve Ability to identify and develop effective coping behaviors will improve Compliance with prescribed medications will improve     Medication Management: Evaluate patient's response, side effects, and tolerance of medication regimen.  Therapeutic Interventions: 1 to 1 sessions, Unit Group sessions and Medication administration.  Evaluation of Outcomes: Not Met   RN Treatment Plan for Primary Diagnosis: <principal problem not specified> Long Term Goal(s): Knowledge of disease and therapeutic regimen to maintain health will improve  Short Term Goals: Ability to verbalize feelings will improve and Compliance with prescribed medications will improve  Medication Management: RN will administer medications as ordered by provider, will assess and evaluate patient's response and provide education to patient for prescribed medication. RN will report any adverse and/or side effects to prescribing provider.  Therapeutic Interventions: 1 on 1 counseling sessions, Psychoeducation, Medication administration, Evaluate responses to treatment, Monitor vital signs and CBGs as ordered, Perform/monitor CIWA, COWS, AIMS and Fall Risk screenings as ordered, Perform wound care treatments as ordered.  Evaluation of Outcomes: Not Met   LCSW Treatment Plan for Primary Diagnosis: <principal problem not specified> Long Term Goal(s): Safe transition to appropriate next level of care at discharge, Engage patient in therapeutic group addressing interpersonal concerns.  Short Term Goals: Engage patient in aftercare planning with referrals and resources, Identify triggers associated with mental health/substance abuse issues and Increase skills for wellness and recovery  Therapeutic Interventions: Assess for all discharge needs, 1 to 1 time with Social worker, Explore available resources and support systems, Assess for adequacy in community support network, Educate  family and significant other(s) on suicide prevention, Complete Psychosocial Assessment, Interpersonal group therapy.  Evaluation of Outcomes: Not Met  Progress in Treatment: Attending groups: No. Participating in groups: No. Taking medication as prescribed: No. Refusing meds. Toleration medication: No. Family/Significant other contact made: No, will contact:  mother Patient understands diagnosis: No. Discussing patient identified problems/goals with staff: Yes. Medical problems stabilized or resolved: Yes. Denies suicidal/homicidal ideation: Yes. Issues/concerns per patient self-inventory: Yes.  New problem(s) identified: Yes, Describe:  family stressors, limited social supports.  New Short Term/Long Term Goal(s):  Patient Goals:    Discharge Plan or Barriers: CSW assessing for appropriate referrals for safe discharge planning. Patient is not currently established with providers and has declined follow up.  Reason for Continuation of Hospitalization: Anxiety Delusions  Hallucinations Medication stabilization  Estimated Length of Stay: 3-5 days  Attendees: Patient: 08/17/2019 9:18 AM  Physician:  08/17/2019 9:18 AM  Nursing:  08/17/2019 9:18 AM  RN Care Manager: 08/17/2019 9:18 AM  Social Worker: Stephanie Acre, [place] 08/17/2019 9:18 AM  Recreational Therapist:  08/17/2019 9:18 AM  Other:  08/17/2019 9:18 AM  Other:  08/17/2019 9:18 AM  Other: 08/17/2019 9:18 AM    Scribe for Treatment Team: Joellen [place], [place] 08/17/2019 9:18 AM

## 2019-08-17 NOTE — [facility] Suicide Risk Assessment (Signed)
[facility] INPATIENT:  Family/Significant Other Suicide Prevention Education  Suicide Prevention Education:  Education Completed; Jerome Lewis, mother [phone] has been identified by the patient as the family member/significant other with whom the patient will be residing, and identified as the person(s) who will aid the patient in the event of a mental health crisis (suicidal ideations/suicide attempt).  With written consent from the patient, the family member/significant other has been provided the following suicide prevention education, prior to the and/or following the discharge of the patient.  The suicide prevention education provided includes the following:  Suicide risk factors  Suicide prevention and interventions  National Suicide Hotline telephone number  [facility] assessment telephone number  [facility] Emergency Assistance 911  [facility] and/or Residential Mobile Crisis Unit telephone number  Request made of family/significant other to:  Remove weapons (e.g., guns, rifles, knives), all items previously/currently identified as safety concern.    Remove drugs/medications (over-the-counter, prescriptions, illicit drugs), all items previously/currently identified as a safety concern.  The family member/significant other verbalizes understanding of the suicide prevention education information provided.  The family member/significant other agrees to remove the items of safety concern listed above. Jerome Lewis reports she had the pt IVC'd due to him "displaying irrational behavior. He thought someone was coming into our house, he thought he was being watched and followed." She also reports he had stayed up for several days and when he did sleep would sleep the entire day.  She states he is upset with her because she had him hospitalized. She states he lives in her home and she has no reservation about him returning to the home. She denies him having access to guns or  weapons in the home.   Jerome Lewis 08/17/2019, 11:10 AM

## 2019-08-17 NOTE — Progress Notes (Addendum)
[facility] MD Progress Note  08/17/2019 2:29 PM TERREON EKHOLM  MRN:  [id number] Subjective:  "I'm tired."  Mr. Seabrooks found lying in bed. He complains of sedation from his medications. He reports good sleep overnight but has still felt sleepy throughout the day. Nursing confirms patient has spent the day sleeping for the last two days, with 6 hours of sleep recorded overnight as well. He refused both doses of Risperdal yesterday but took morning dose today. He continues to deny psychotic symptoms as reported by his mother prior to admission. He has been isolating to his room and remaining in bed. He denies any AVH or paranoia and shows no signs of responding to internal stimuli. No delusional thought content expressed. He continues to minimize drug use. No agitated or disruptive behaviors on the unit.   From admission H&P: The patient presents with a drug screen positive for amphetamines stating he had taken someone's Adderall however he has had a cluster of psychotic symptoms to include insomnia for at least a week, possibly 2 weeks, intense paranoia, going in the yard with a spotlight believing someone was trying to break he had, and bizarre delusions, at one point insisting there was metal under his skin in his leg and at another point telling his mother he could see parasites coming out of the skin.   Principal Problem: <principal problem not specified> Diagnosis: Active Problems:   Schizophreniform disorder (HCC)   Psychosis ([place])  Total Time spent with patient: 15 minutes  Past Psychiatric History: See admission H&P  Past Medical History: History reviewed. No pertinent past medical history. History reviewed. No pertinent surgical history. Family History: History reviewed. No pertinent family history. Family Psychiatric  History: See admission H&P Social History:  Social History   Substance and Sexual Activity  Alcohol Use Yes     Social History   Substance and Sexual Activity  Drug Use  Yes  . Types: Marijuana    Social History   Socioeconomic History  . Marital status: Single    Spouse name: Not on file  . Number of children: Not on file  . Years of education: Not on file  . Highest education level: Not on file  Occupational History  . Not on file  Tobacco Use  . Smoking status: Current Some Day Smoker  . Smokeless tobacco: Never Used  Substance and Sexual Activity  . Alcohol use: Yes  . Drug use: Yes    Types: Marijuana  . Sexual activity: Not on file  Other Topics Concern  . Not on file  Social History Narrative  . Not on file   Social Determinants of Health   Financial Resource Strain:   . Difficulty of Paying Living Expenses: Not on file  Food Insecurity:   . Worried About Charity fundraiser in the Last Year: Not on file  . Ran Out of Food in the Last Year: Not on file  Transportation Needs:   . Lack of Transportation (Medical): Not on file  . Lack of Transportation (Non-Medical): Not on file  Physical Activity:   . Days of Exercise per Week: Not on file  . Minutes of Exercise per Session: Not on file  Stress:   . Feeling of Stress : Not on file  Social Connections:   . Frequency of Communication with Friends and Family: Not on file  . Frequency of Social Gatherings with Friends and Family: Not on file  . Attends Religious Services: Not on file  . Active  Member of Clubs or Organizations: Not on file  . Attends Banker Meetings: Not on file  . Marital Status: Not on file   Additional Social History:                         Sleep: Good  Appetite:  Good  Current Medications: Current Facility-Administered Medications  Medication Dose Route Frequency Provider Last Rate Last Admin  . acetaminophen (TYLENOL) tablet 650 mg  650 mg Oral Q6H PRN Nira Conn A, NP      . alum & mag hydroxide-simeth (MAALOX/MYLANTA) 200-200-20 MG/5ML suspension 30 mL  30 mL Oral Q4H PRN Nira Conn A, NP      . feeding supplement (BOOST  / RESOURCE BREEZE) liquid 1 Container  1 Container Oral Q24H Malvin Johns, MD   1 Container at 08/17/19 1150  . feeding supplement (ENSURE ENLIVE) (ENSURE ENLIVE) liquid 237 mL  237 mL Oral Q24H Malvin Johns, MD      . hydrOXYzine (ATARAX/VISTARIL) tablet 25 mg  25 mg Oral TID PRN Nira Conn A, NP      . magnesium hydroxide (MILK OF MAGNESIA) suspension 30 mL  30 mL Oral Daily PRN Nira Conn A, NP      . omega-3 acid ethyl esters (LOVAZA) capsule 1 g  1 g Oral BID Malvin Johns, MD      . prenatal multivitamin tablet 1 tablet  1 tablet Oral Q1200 Malvin Johns, MD      . risperiDONE (RISPERDAL) tablet 2 mg  2 mg Oral BID Malvin Johns, MD   2 mg at 08/17/19 0454  . temazepam (RESTORIL) capsule 15 mg  15 mg Oral QHS PRN Nira Conn A, NP      . temazepam (RESTORIL) capsule 30 mg  30 mg Oral QHS Malvin Johns, MD   30 mg at 08/15/19 2134    Lab Results:  Results for orders placed or performed during the hospital encounter of 08/15/19 (from the past 48 hour(s))  Hemoglobin A1c     Status: None   Collection Time: 08/16/19  7:07 PM  Result Value Ref Range   Hgb A1c MFr Bld 5.4 4.8 - 5.6 %    Comment: (NOTE) Pre diabetes:          5.7%-6.4% Diabetes:              >6.4% Glycemic control for   <7.0% adults with diabetes    Mean Plasma Glucose 108.28 mg/dL    Comment: Performed at [facility], [address] [place], [place] [address]  Lipid panel     Status: Abnormal   Collection Time: 08/16/19  7:07 PM  Result Value Ref Range   Cholesterol 178 0 - 200 mg/dL   Triglycerides 914 (H) <150 mg/dL   HDL 42 >78 mg/dL   Total CHOL/HDL Ratio 4.2 RATIO   VLDL 43 (H) 0 - 40 mg/dL   LDL Cholesterol 93 0 - 99 mg/dL    Comment:        Total Cholesterol/HDL:CHD Risk Coronary Heart Disease Risk Table                     Men   Women  1/2 Average Risk   3.4   3.3  Average Risk       5.0   4.4  2 X Average Risk   9.6   7.1  3 X Average Risk  23.4  11.0        Use the calculated Patient  Ratio above and the CHD Risk Table to determine the patient's CHD Risk.        ATP III CLASSIFICATION (LDL):  <100     mg/dL   Optimal  361-443  mg/dL   Near or Above                    Optimal  130-159  mg/dL   Borderline  154-008  mg/dL   High  >676     mg/dL   Very High Performed at [facility], [address], [place], [place] [address]   TSH     Status: None   Collection Time: 08/16/19  7:07 PM  Result Value Ref Range   TSH 1.419 0.350 - 4.500 uIU/mL    Comment: Performed by a 3rd Generation assay with a functional sensitivity of <=0.01 uIU/mL. Performed at [facility], [address], [place], [place] [address]     Blood Alcohol level:  Lab Results  Component Value Date   ETH <10 08/14/2019    Metabolic Disorder Labs: Lab Results  Component Value Date   HGBA1C 5.4 08/16/2019   MPG 108.28 08/16/2019   No results found for: PROLACTIN Lab Results  Component Value Date   CHOL 178 08/16/2019   TRIG 213 (H) 08/16/2019   HDL 42 08/16/2019   CHOLHDL 4.2 08/16/2019   VLDL 43 (H) 08/16/2019   LDLCALC 93 08/16/2019    Physical Findings: AIMS: Facial and Oral Movements Muscles of Facial Expression: None, normal Lips and Perioral Area: None, normal Jaw: None, normal Tongue: None, normal,Extremity Movements Upper (arms, wrists, hands, fingers): None, normal Lower (legs, knees, ankles, toes): None, normal, Trunk Movements Neck, shoulders, hips: None, normal, Overall Severity Severity of abnormal movements (highest score from questions above): None, normal Incapacitation due to abnormal movements: None, normal Patient's awareness of abnormal movements (rate only patient's report): No Awareness, Dental Status Current problems with teeth and/or dentures?: No Does patient usually wear dentures?: No  CIWA:    COWS:     Musculoskeletal: Strength & Muscle Tone: within normal limits Gait & Station: normal Patient leans:  N/A  Psychiatric Specialty Exam: Physical Exam  Nursing note and vitals reviewed. Constitutional: He is oriented to person, place, and time. He appears well-developed and well-nourished.  Respiratory: Effort normal.  Musculoskeletal:        General: Normal range of motion.  Neurological: He is alert and oriented to person, place, and time.    Review of Systems  Constitutional: Negative.   Respiratory: Negative for cough and shortness of breath.   Cardiovascular: Negative for chest pain.  Psychiatric/Behavioral: Negative for agitation, behavioral problems, confusion, dysphoric mood, hallucinations, self-injury, sleep disturbance and suicidal ideas. The patient is not nervous/anxious and is not hyperactive.     Blood pressure 111/83, pulse (!) 116, temperature 98.2 F (36.8 C), temperature source Oral, resp. rate 18, height 6\' 1"  (1.854 m), weight 79.4 kg, SpO2 100 %.Body mass index is 23.09 kg/m.  General Appearance: Fairly Groomed  Eye Contact:  Good  Speech:  Normal Rate  Volume:  Normal  Mood:  Euthymic  Affect:  Congruent  Thought Process:  Coherent  Orientation:  Full (Time, Place, and Person)  Thought Content:  Logical  Suicidal Thoughts:  No  Homicidal Thoughts:  No  Memory:  Immediate;   Good Recent;   Fair  Judgement:  Intact  Insight:  Lacking  Psychomotor Activity:  Decreased  Concentration:  Concentration: Fair and Attention Span: Fair  Recall:  [employer] of Knowledge:  Fair  Language:  Good  Akathisia:  No  Handed:  Right  AIMS (if indicated):     Assets:  Communication Skills Housing Resilience Social Support  ADL's:  Intact  Cognition:  WNL  Sleep:  Number of Hours: 6     Treatment Plan Summary: Daily contact with patient to assess and evaluate symptoms and progress in treatment and Medication management   Continue inpatient hospitalization.  Decrease Risperdal to 1 mg PO BID for psychosis Decrease Restoril to 15 mg PO QHS PRN  insomnia Continue Lovaza 1 g PO BID for neuroprotection Continue Vistaril 25 mg PO TID PRN anxiety  Patient will participate in the therapeutic group milieu.  Discharge disposition in progress.   Aldean Baker, NP 08/17/2019, 2:29 PM   I saw patient along with Marylu Lund, NP.  16 y old male, presented under IVC due to bizarre behaviors, delusions,including thinking that he has magnets under his skin, appearing paranoid,internally preoccupied,poor sleep, poor appetite. Patient denied/minimized these concerns at admission. History of substance use disorder. Admission UDS positive for amphetamines, BAL negative. Patient reported he had taken somebody else's Adderall .  Today patient presents calm, guarded /cautious but polite on approach. He states that current medication regimen is causing him to feel sedated . ( currently presents alert, attentive, but states he feels drowsy). Denies suicidal ideations . At this time denies hallucinations and does not appear internally preoccupied. No disruptive or agitated behavior. Isolative in room, limited milieu participation at this time. Labs - Plasma glucose 108, Lipid Pane- mild hyperglyceridemia  ( 213), TSH 1.419, HgbA1C 5.4   Plan- Inpatient admission Decrease Risperidone to 1 mgr BID. Decrease Restoril to 15 mgrs QHS PRN for insomnia. Continue Lovaza for hypertriglyceridemia.  Dr. Jeannine Kitten will evaluate in AM.  Sallyanne Havers MD

## 2019-08-17 NOTE — Progress Notes (Signed)
   08/17/19 1300  Psych Admission Type (Psych Patients Only)  Admission Status Involuntary  Psychosocial Assessment  Patient Complaints None  Eye Contact Fair  Facial Expression Sullen;Sad  Affect Appropriate to circumstance;Sad;Sullen  Speech Logical/coherent  Interaction Avoidant;Forwards little;Guarded;Minimal  Motor Activity Slow  Appearance/Hygiene In scrubs  Behavior Characteristics Appropriate to situation  Mood Depressed  Thought Process  Coherency Tangential  Content Blaming others  Delusions Paranoid  Perception WDL  Hallucination None reported or observed  Judgment Poor  Confusion None  Danger to Self  Current suicidal ideation? Denies  Danger to Others  Danger to Others None reported or observed

## 2019-08-17 NOTE — Progress Notes (Signed)
NUTRITION ASSESSMENT  Pt identified as at risk on the Malnutrition Screen Tool  INTERVENTION: - will order Boost Breeze once/day, each supplement provides 250 kcal and 9 grams of protein. - will order Ensure Enlive once/day, each supplement provides 350 kcal and 20 grams of protein. - will order daily multivitamin with minerals.   NUTRITION DIAGNOSIS: Unintentional weight loss related to sub-optimal intake as evidenced by pt report.   Goal: Pt to meet >/= 90% of their estimated nutrition needs.  Monitor:  PO intake  Assessment:  Patient admitted as an involuntary commit due to extensive hx of polysubstance abuse and noted paranoia PTA. UDS on admission was positive for amphetamines. Notes state that patient has been denying symptoms and minimizing events/actions. Reported insomnia x2 weeks.   Current weight is 175 lb and PTA the only other weight recorded was on 01/01/15 when he weighed 214 lb. This indicates 39 lb weight loss in 4.5 years; not significant for time frame.    29 y.o. male  Height: Ht Readings from Last 1 Encounters:  08/15/19 6\' 1"  (1.854 m)    Weight: Wt Readings from Last 1 Encounters:  08/15/19 79.4 kg    Weight Hx: Wt Readings from Last 10 Encounters:  08/15/19 79.4 kg  08/14/19 79.4 kg  01/01/15 97.5 kg    BMI:  Body mass index is 23.09 kg/m. Pt meets criteria for normal weight based on current BMI.  Estimated Nutritional Needs: Kcal: 25-30 kcal/kg Protein: > 1 gram protein/kg Fluid: 1 ml/kcal  Diet Order:  Diet Order            Diet regular Room service appropriate? Yes; Fluid consistency: Thin  Diet effective now             Pt is also offered choice of unit snacks mid-morning and mid-afternoon.  Pt is eating as desired.   Lab results and medications reviewed.     01/03/15, MS, RD, LDN, CNSC Inpatient Clinical Dietitian RD pager # available in [facility]  After hours/weekend pager # available in [facility]

## 2019-08-17 NOTE — Progress Notes (Signed)
Recreation Therapy Notes  Date: 3.8.21 Time: 1000 Location: 500 Hall Dayroom  Group Topic: Coping Skills  Goal Area(s) Addresses:  Patient will identify positive coping skills. Patient will identify benefit of using positive coping skills post d/c.  Intervention: Worksheet  Activity: Healthy vs. Unhealthy Coping Strategies.  Patients were to identify a problem they are currently dealing with.  Patients then identified the unhealthy coping strategies used and the consequences of those coping strategies.  Patients then identified positive coping strategies, expected outcomes and barriers to using healthy coping strategies.  Education: Pharmacologist, Building control surveyor.   Education Outcome: Acknowledges understanding/In group clarification offered/Needs additional education.   Clinical Observations/Feedback: Pt did not attend group session.    Caroll Rancher, LRT/CTRS         Caroll Rancher A 08/17/2019 12:15 PM

## 2019-08-18 LAB — PROLACTIN: Prolactin: 24.6 ng/mL — ABNORMAL HIGH (ref 4.0–15.2)

## 2019-08-18 NOTE — Progress Notes (Signed)
[facility] MD Progress Note  08/18/2019 1:51 PM Jerome Lewis  MRN:  [id number] Subjective:   Patient now refusing medication saying it is very sedating -states he does not need treatment denies wanting to harm self and states all previous expressed material was a "misunderstandings" he is however coherent and his thought and goal-directed and does not appear to be psychotic at this point in time, it is possible that he had an amphetamine induced psychosis that has resolved however he has had a prodrome for some time and we believe he has had untreated psychosis for some time  Principal Problem: Psychotic disorder Diagnosis: Active Problems:   Schizophreniform disorder (HCC)   Psychosis (HCC)  Total Time spent with patient: 20 minutes  Past Psychiatric History:see Eval  Past Medical History: History reviewed. No pertinent past medical history. History reviewed. No pertinent surgical history. Family History: History reviewed. No pertinent family history. Family Psychiatric  History: see eval Social History:  Social History   Substance and Sexual Activity  Alcohol Use Yes     Social History   Substance and Sexual Activity  Drug Use Yes  . Types: Marijuana    Social History   Socioeconomic History  . Marital status: Single    Spouse name: Not on file  . Number of children: Not on file  . Years of education: Not on file  . Highest education level: Not on file  Occupational History  . Not on file  Tobacco Use  . Smoking status: Current Some Day Smoker  . Smokeless tobacco: Never Used  Substance and Sexual Activity  . Alcohol use: Yes  . Drug use: Yes    Types: Marijuana  . Sexual activity: Not on file  Other Topics Concern  . Not on file  Social History Narrative  . Not on file   Social Determinants of Health   Financial Resource Strain:   . Difficulty of Paying Living Expenses: Not on file  Food Insecurity:   . Worried About Programme researcher, broadcasting/film/video in the Last Year: Not on  file  . Ran Out of Food in the Last Year: Not on file  Transportation Needs:   . Lack of Transportation (Medical): Not on file  . Lack of Transportation (Non-Medical): Not on file  Physical Activity:   . Days of Exercise per Week: Not on file  . Minutes of Exercise per Session: Not on file  Stress:   . Feeling of Stress : Not on file  Social Connections:   . Frequency of Communication with Friends and Family: Not on file  . Frequency of Social Gatherings with Friends and Family: Not on file  . Attends Religious Services: Not on file  . Active Member of Clubs or Organizations: Not on file  . Attends Banker Meetings: Not on file  . Marital Status: Not on file   Additional Social History:                         Sleep: Good  Appetite:  Good  Current Medications: Current Facility-Administered Medications  Medication Dose Route Frequency Provider Last Rate Last Admin  . acetaminophen (TYLENOL) tablet 650 mg  650 mg Oral Q6H PRN Nira Conn A, NP      . alum & mag hydroxide-simeth (MAALOX/MYLANTA) 200-200-20 MG/5ML suspension 30 mL  30 mL Oral Q4H PRN Nira Conn A, NP      . feeding supplement (BOOST / RESOURCE BREEZE) liquid 1 Container  Raymond Johnn Hai, MD   1 Container at 08/17/19 1150  . feeding supplement (ENSURE ENLIVE) (ENSURE ENLIVE) liquid 237 mL  237 mL Oral Q24H Johnn Hai, MD      . hydrOXYzine (ATARAX/VISTARIL) tablet 25 mg  25 mg Oral TID PRN Lindon Romp A, NP      . magnesium hydroxide (MILK OF MAGNESIA) suspension 30 mL  30 mL Oral Daily PRN Lindon Romp A, NP      . omega-3 acid ethyl esters (LOVAZA) capsule 1 g  1 g Oral BID Johnn Hai, MD      . prenatal multivitamin tablet 1 tablet  1 tablet Oral Q1200 Johnn Hai, MD   1 tablet at 08/17/19 1717  . risperiDONE (RISPERDAL) tablet 1 mg  1 mg Oral BID Connye Burkitt, NP   1 mg at 08/17/19 1717  . temazepam (RESTORIL) capsule 15 mg  15 mg Oral QHS PRN Rozetta Nunnery,  NP        Lab Results:  Results for orders placed or performed during the hospital encounter of 08/15/19 (from the past 48 hour(s))  Hemoglobin A1c     Status: None   Collection Time: 08/16/19  7:07 PM  Result Value Ref Range   Hgb A1c MFr Bld 5.4 4.8 - 5.6 %    Comment: (NOTE) Pre diabetes:          5.7%-6.4% Diabetes:              >6.4% Glycemic control for   <7.0% adults with diabetes    Mean Plasma Glucose 108.28 mg/dL    Comment: Performed at [facility], [place] [address] [place], [place] [address]  Lipid panel     Status: Abnormal   Collection Time: 08/16/19  7:07 PM  Result Value Ref Range   Cholesterol 178 0 - 200 mg/dL   Triglycerides 213 (H) <150 mg/dL   HDL 42 >40 mg/dL   Total CHOL/HDL Ratio 4.2 RATIO   VLDL 43 (H) 0 - 40 mg/dL   LDL Cholesterol 93 0 - 99 mg/dL    Comment:        Total Cholesterol/HDL:CHD Risk Coronary Heart Disease Risk Table                     Men   Women  1/2 Average Risk   3.4   3.3  Average Risk       5.0   4.4  2 X Average Risk   9.6   7.1  3 X Average Risk  23.4   11.0        Use the calculated Patient Ratio above and the CHD Risk Table to determine the patient's CHD Risk.        ATP III CLASSIFICATION (LDL):  <100     mg/dL   Optimal  100-129  mg/dL   Near or Above                    Optimal  130-159  mg/dL   Borderline  160-189  mg/dL   High  >190     mg/dL   Very High Performed at [facility] [address], [place], [place] [address]   TSH     Status: None   Collection Time: 08/16/19  7:07 PM  Result Value Ref Range   TSH 1.419 0.350 - 4.500 uIU/mL    Comment: Performed by a 3rd Generation assay  with a functional sensitivity of <=0.01 uIU/mL. Performed at [facility], [address], [place], [place] [address]   Prolactin     Status: Abnormal   Collection Time: 08/16/19  7:07 PM  Result Value Ref Range   Prolactin 24.6 (H) 4.0 - 15.2 ng/mL    Comment: (NOTE) Performed  At: [facility] [address] [place], [place] [id number] Jolene Schimke MD JG:[id number]     Blood Alcohol level:  Lab Results  Component Value Date   [facility] <10 08/14/2019    Metabolic Disorder Labs: Lab Results  Component Value Date   HGBA1C 5.4 08/16/2019   MPG 108.28 08/16/2019   Lab Results  Component Value Date   PROLACTIN 24.6 (H) 08/16/2019   Lab Results  Component Value Date   CHOL 178 08/16/2019   TRIG 213 (H) 08/16/2019   HDL 42 08/16/2019   CHOLHDL 4.2 08/16/2019   VLDL 43 (H) 08/16/2019   LDLCALC 93 08/16/2019    Physical Findings: AIMS: Facial and Oral Movements Muscles of Facial Expression: None, normal Lips and Perioral Area: None, normal Jaw: None, normal Tongue: None, normal,Extremity Movements Upper (arms, wrists, hands, fingers): None, normal Lower (legs, knees, ankles, toes): None, normal, Trunk Movements Neck, shoulders, hips: None, normal, Overall Severity Severity of abnormal movements (highest score from questions above): None, normal Incapacitation due to abnormal movements: None, normal Patient's awareness of abnormal movements (rate only patient's report): No Awareness, Dental Status Current problems with teeth and/or dentures?: No Does patient usually wear dentures?: No  CIWA:    COWS:     Musculoskeletal: Strength & Muscle Tone: within normal limits Gait & Station: normal Patient leans: N/A  Psychiatric Specialty Exam: Physical Exam  Review of Systems  Blood pressure 94/67, pulse 89, temperature 98 F (36.7 C), temperature source Oral, resp. rate 18, height 6\' 1"  (1.854 m), weight 79.4 kg, SpO2 100 %.Body mass index is 23.09 kg/m.  General Appearance: Casual  Eye Contact:  Good  Speech:  Clear and Coherent  Volume:  Decreased  Mood:  Irritable  Affect:  Congruent  Thought Process:  Goal Directed  Orientation:  Full (Time, Place, and Person)  Thought Content:  denies Any psychotic symptoms  Suicidal Thoughts:   No  Homicidal Thoughts:  No  Memory:  Immediate;   Fair Recent;   Fair Remote;   Fair  Judgement:  Impaired  Insight:  Lacking  Psychomotor Activity:  Normal  Concentration:  Concentration: Fair and Attention Span: Fair  Recall:  of Knowledge:  Fair  Language:  Fair  Akathisia:  Negative  Handed:  Right  AIMS (if indicated):     Assets:  Social Support  ADL's:  Intact  Cognition:  WNL  Sleep:  Number of Hours: 9.75     Treatment Plan Summary: Daily contact with patient to assess and evaluate symptoms and progress in treatment and Medication management  Monitor for safety decrease risperidone try to catch up with family may discharge tomorrow  [employer], MD 08/18/2019, 1:51 PM

## 2019-08-18 NOTE — Progress Notes (Signed)
   08/18/19 0702  Psych Admission Type (Psych Patients Only)  Admission Status Involuntary  Psychosocial Assessment  Patient Complaints None (asleep)  Eye Contact Other (Comment) (sleep)  Facial Expression Other (Comment) (sleep)  Affect Other (Comment) (sleep)  Speech Logical/coherent  Interaction Other (Comment) (sleep)  Motor Activity Slow  Appearance/Hygiene In scrubs  Mood Other (Comment) (sleep)  Thought Process  Coherency Unable to assess  Content UTA  Delusions UTA  Perception UTA  Hallucination UTA  Judgment UTA  Confusion UTA  Danger to Self  Current suicidal ideation? Denies (sleep)  Danger to Others  Danger to Others None reported or observed (sleep)  Pt asleep all shift. Did not get up this morning for vitals signs or breakfast. Pt safety maintained on unit.

## 2019-08-18 NOTE — Progress Notes (Signed)
Recreation Therapy Notes  Date: 3.9.21 Time: 1000 Location: 500 Hall Dayroom   Group Topic: Leisure Education  Goal Area(s) Addresses:  Patient will identify positive leisure activities.  Patient will identify one positive benefit of participation in leisure activities.   Intervention: Leisure Group Game  Activity:  Keep It Going Volleyball.  LRT and patients were to keep the beach in motion as long as possible without it coming to a stop.  LRT timed the group to see how long the group could keep the ball in motion.  Each participant was to remain seated unless the ball was hit out of reach of the next closest participant.  Each time the ball comes to a complete stop, LRT would start the time over.  Education:  Leisure Education, Discharge Planning  Education Outcome: Acknowledges education/In group clarification offered/Needs additional education  Clinical Observations/Feedback: Pt did not attend group session.    Smayan Hackbart, LRT/CTRS         Otis Portal A 08/18/2019 11:25 AM 

## 2019-08-18 NOTE — Progress Notes (Signed)
DAR NOTE: Patient presents with irritable affect and mood.  Denies suicidal thoughts, pain, auditory and visual hallucinations.    Food and fluid intake poor.  Maintained on routine safety checks.  Refused prescribed medications after several attempts.  MD made aware.  Support and encouragement offered as needed.  States goal for today is "discharge."  Patient remained in his room for most of this shift.  Patient is safe on the unit.

## 2019-08-18 NOTE — Progress Notes (Signed)
Recreation Therapy Notes  INPATIENT RECREATION THERAPY ASSESSMENT  Patient Details Name: Jerome Lewis MRN: [id number] DOB: [birth year] Today's Date: 08/18/2019       Information Obtained From: Patient  Able to Participate in Assessment/Interview: Yes  Patient Presentation: Alert  Reason for Admission (Per Patient): Patient Unable to Identify  Patient Stressors: (None identified)  Coping Skills:   Music, Art, Talk, Avoidance  Leisure Interests (2+):  Music - Listen, Art - Draw  Frequency of Recreation/Participation: Other (Comment)(Daily)  Awareness of Community Resources:  Yes  Community Resources:  Park  Current Use: No  If no, Barriers?: (Pt stated he works out of town so he doesn't have time)  Expressed Interest in [employer] Information: No  [employer] of Residence:  [place]  Patient Main Form of Transportation: Set designer  Patient Strengths:  Drawing; Can be anything I choose to be  Patient Identified Areas of Improvement:  "I feel I was ok"  Patient Goal for Hospitalization:  "go home"  Current SI (including self-harm):  No  Current HI:  No  Current AVH: No  Staff Intervention Plan: Group Attendance, Collaborate with Interdisciplinary Treatment Team  Consent to Intern Participation: N/A    Caroll Rancher, LRT/CTRS  Lillia Abed, Michelina Mexicano A 08/18/2019, 1:10 PM

## 2019-08-18 NOTE — BHH Counselor (Signed)
Patient continues to decline outpatient follow up. Patient states he hopes to discharge home soon and he hopes he "won't need to take medication anymore." No other questions or concerns for CSW at this time.  Enid Cutter, MSW, LCSW-A Clinical Social Worker [facility] Adult Unit  [phone]

## 2019-08-19 MED ORDER — ENLYTE PO CAPS: ORAL_CAPSULE | ORAL | 11 refills | Status: AC

## 2019-08-19 NOTE — Plan of Care (Signed)
Pt did not attend recreation therapy group sessions.   Korene Dula, LRT/CTRS 

## 2019-08-19 NOTE — Progress Notes (Signed)
Recreation Therapy Notes  INPATIENT RECREATION TR PLAN  Patient Details Name: Jerome Lewis MRN: [id number] DOB: 02-05-1991 Today's Date: 08/19/2019  Rec Therapy Plan Is patient appropriate for Therapeutic Recreation?: Yes Treatment times per week: about 3 days Estimated Length of Stay: 5-7 days TR Treatment/Interventions: Group participation (Comment)  Discharge Criteria Pt will be discharged from therapy if:: Discharged Treatment plan/goals/alternatives discussed and agreed upon by:: Patient/family  Discharge Summary Short term goals set: See patient care plan Short term goals met: Not met Reason goals not met: Pt did not attend group sessions. Therapeutic equipment acquired: N/A Reason patient discharged from therapy: Discharge from hospital Pt/family agrees with progress & goals achieved: Yes Date patient discharged from therapy: 08/19/19    [name], LRT/CTRS  [name], [name] 08/19/2019, 11:37 AM

## 2019-08-19 NOTE — Plan of Care (Signed)
Discharge note  Patient verbalizes readiness for discharge. Follow up plan explained, AVS, Transition record and SRA given. Prescriptions and teaching provided. Belongings returned and signed for. Suicide safety plan completed and signed. Patient verbalizes understanding. Patient denies SI/HI and assures this Clinical research associate they will seek assistance should that change. Patient discharged to lobby where pt's mother was waiting.  Problem: Education: Goal: Knowledge of disease or condition will improve Outcome: Adequate for Discharge Goal: Understanding of discharge needs will improve Outcome: Adequate for Discharge   Problem: Health Behavior/Discharge Planning: Goal: Ability to identify changes in lifestyle to reduce recurrence of condition will improve Outcome: Adequate for Discharge Goal: Identification of resources available to assist in meeting health care needs will improve Outcome: Adequate for Discharge   Problem: Physical Regulation: Goal: Complications related to the disease process, condition or treatment will be avoided or minimized Outcome: Adequate for Discharge   Problem: Safety: Goal: Ability to remain free from injury will improve Outcome: Adequate for Discharge   Problem: Education: Goal: Knowledge of General Education information will improve Description: Including pain rating scale, medication(s)/side effects and non-pharmacologic comfort measures Outcome: Adequate for Discharge   Problem: Health Behavior/Discharge Planning: Goal: Ability to manage health-related needs will improve Outcome: Adequate for Discharge   Problem: Clinical Measurements: Goal: Ability to maintain clinical measurements within normal limits will improve Outcome: Adequate for Discharge Goal: Will remain free from infection Outcome: Adequate for Discharge Goal: Diagnostic test results will improve Outcome: Adequate for Discharge Goal: Respiratory complications will improve Outcome: Adequate for  Discharge Goal: Cardiovascular complication will be avoided Outcome: Adequate for Discharge   Problem: Activity: Goal: Risk for activity intolerance will decrease Outcome: Adequate for Discharge   Problem: Nutrition: Goal: Adequate nutrition will be maintained Outcome: Adequate for Discharge   Problem: Coping: Goal: Level of anxiety will decrease Outcome: Adequate for Discharge   Problem: Elimination: Goal: Will not experience complications related to bowel motility Outcome: Adequate for Discharge Goal: Will not experience complications related to urinary retention Outcome: Adequate for Discharge   Problem: Pain Managment: Goal: General experience of comfort will improve Outcome: Adequate for Discharge   Problem: Safety: Goal: Ability to remain free from injury will improve Outcome: Adequate for Discharge   Problem: Skin Integrity: Goal: Risk for impaired skin integrity will decrease Outcome: Adequate for Discharge

## 2019-08-19 NOTE — Discharge Summary (Signed)
Physician Discharge Summary Note  Patient:  Jerome Lewis is an 29 y.o., male  MRN:  [id number]  DOB:  06-Oct-1990  Patient phone:  [phone] (home)   Patient address:   [address]  [place] [address],   Total Time spent with patient: Greater than 30 minutes  Date of Admission:  08/15/2019  Date of Discharge: 08-19-19  Reason for Admission: Worsening drug use & intense paranoia.  Principal Problem: Schizophreniform disorder [facility])  Discharge Diagnoses: Principal Problem:   Schizophreniform disorder (HCC) Active Problems:   Psychosis (HCC)  Past Psychiatric History: Schizophrenia.  Past Medical History: History reviewed. No pertinent past medical history. History reviewed. No pertinent surgical history.  Family History: History reviewed. No pertinent family history.  Family Psychiatric  History: See H&P  Social History:  Social History   Substance and Sexual Activity  Alcohol Use Yes     Social History   Substance and Sexual Activity  Drug Use Yes  . Types: Marijuana    Social History   Socioeconomic History  . Marital status: Single    Spouse name: Not on file  . Number of children: Not on file  . Years of education: Not on file  . Highest education level: Not on file  Occupational History  . Not on file  Tobacco Use  . Smoking status: Current Some Day Smoker  . Smokeless tobacco: Never Used  Substance and Sexual Activity  . Alcohol use: Yes  . Drug use: Yes    Types: Marijuana  . Sexual activity: Not on file  Other Topics Concern  . Not on file  Social History Narrative  . Not on file   Social Determinants of Health   Financial Resource Strain:   . Difficulty of Paying Living Expenses: Not on file  Food Insecurity:   . Worried About Programme researcher, broadcasting/film/video in the Last Year: Not on file  . Ran Out of Food in the Last Year: Not on file  Transportation Needs:   . Lack of Transportation (Medical): Not on file  . Lack of Transportation  (Non-Medical): Not on file  Physical Activity:   . Days of Exercise per Week: Not on file  . Minutes of Exercise per Session: Not on file  Stress:   . Feeling of Stress : Not on file  Social Connections:   . Frequency of Communication with Friends and Family: Not on file  . Frequency of Social Gatherings with Friends and Family: Not on file  . Attends Religious Services: Not on file  . Active Member of Clubs or Organizations: Not on file  . Attends Banker Meetings: Not on file  . Marital Status: Not on file   Hospital Course: (Per Md's admission assessment notes): This is the first psychiatric admission for [name], 29 year old patient who presented under petition for involuntary commitment.  Patient has an extensive history of polysubstance abuse, cannabis dependency, and approximately 3 years ago confessed to his parents that he was dependent on opiates, had abused some cocaine, and at that point in time was using cannabis daily.  It was at that point that he first began displaying paranoia-that his mother initially felt it was due to multiple stressors combined with drug use.  He lost his job, his car his girlfriend support had a cascading of negative events due to his chemical dependency issue but remained without treatment until this presentation.The patient presents with a drug screen positive for amphetamines stating he had taken someone's  Adderall however he has had a cluster of psychotic symptoms to include insomnia for at least a week, possibly 2 weeks, intense paranoia, going in the yard with a spotlight believing someone was trying to break he had, and bizarre delusions, at one point insisting there was metal under his skin in his leg and at another point telling his mother he could see parasites coming out of the skin.  Further he would watch videos for hours at a time, exactly the same video for hours at a time, believes his dogs have been "fused together" when they were  simply lying next which other, believe there were people in the woods trying to come in the house, nailed his bed to the floor nailed his closet shot and put cameras in his room due to his paranoia.  He believes people have been coming into his home/room stealing things breaking things so forth. When asked about the statement that there was metal under the scan of his leg he states "that again was taken out of context that another thing, all I did was rub metal on my skin". The patient himself denies all of the symptoms and minimizes everything, he does acknowledge vague paranoia saying "you say things when you are being manipulated and controlled" but then states he is making a general statement and no one is controlling him.  When asked about the metal or magnets under his leg he states "that was taken out of context" and every time I bring up one of the delusional statements he again insists that people misunderstood him and becomes little irritated with questions of the mental status exam.  He states he is "never hallucinated".  This is the first psychiatric admission/discharge summary in this [facility] for this 29 year old male with hx of polysubstance use disorder. He was brought to the hospital ED for crisis management due to worsening drug use, intense paranoia & bizarre delusions. He was recommended for evaluation & mood stabilization treatments. His UDS was positive for Methamphetamines.  After evaluation of his presenting symptoms, Saafir was recommended for mood stabilization treatments. The medication regimen for his presenting symptoms were discussed & with his consent initiated. He received, stabilized & was discharged on the medications as listed below on his discharge medication lists. He was also enrolled & participated in the group counseling sessions being offered & held on this unit. He learned coping skills. He presented on this admission, other pre-existing medical condition that required  treatment or monitoring. He tolerated his treatment regimen without any adverse effects or reactions reported.   During the course of his hospitalization, the 15-minute checks were adequate to ensure Herchel's safety. Patient did not display any dangerous, violent or suicidal behavior on the unit.  He interacted with patients & staff appropriately, participated appropriately in the group sessions/therapies. His medications were addressed & adjusted to meet his needs. He was recommended for outpatient follow-up care & medication management upon discharge to assure his continuity of care.  At the time of discharge patient is not reporting any acute suicidal/homicidal ideations. He feels more confident about his self & mental health care. He currently denies any new issues or concerns. Education & supportive counseling provided throughout her hospital stay & upon discharge.   Today upon his discharge evaluation with the attending psychiatrist, Jake shares he is doing well. He denies any other specific concerns. He is sleeping well. His appetite is good. He denies other physical complaints. He denies AH/VH. He feels that his  medications have been helpful & is in agreement to continue his current treatment regimen as recommended. He was able to engage in safety planning including plan to return to [facility] or contact emergency services if he feels unable to maintain his own safety or the safety of others. Pt had no further questions, comments, or concerns. He left [facility] with all personal belongings in no apparent distress. Transportation per his family (mother).  Physical Findings: AIMS: Facial and Oral Movements Muscles of Facial Expression: None, normal Lips and Perioral Area: None, normal Jaw: None, normal Tongue: None, normal,Extremity Movements Upper (arms, wrists, hands, fingers): None, normal Lower (legs, knees, ankles, toes): None, normal, Trunk Movements Neck, shoulders, hips: None, normal, Overall  Severity Severity of abnormal movements (highest score from questions above): None, normal Incapacitation due to abnormal movements: None, normal Patient's awareness of abnormal movements (rate only patient's report): No Awareness, Dental Status Current problems with teeth and/or dentures?: No Does patient usually wear dentures?: No  CIWA:    COWS:     Musculoskeletal: Strength & Muscle Tone: within normal limits Gait & Station: normal Patient leans: N/A  Psychiatric Specialty Exam: Physical Exam  Nursing note and vitals reviewed. Constitutional: He is oriented to person, place, and time. He appears well-developed.  HENT:  Head: Normocephalic.  Cardiovascular: Normal rate.  Respiratory: Effort normal.  Genitourinary:    Genitourinary Comments: Deferred   Musculoskeletal:        General: Normal range of motion.     Cervical back: Normal range of motion.  Neurological: He is alert and oriented to person, place, and time.  Skin: Skin is dry.    Review of Systems  Constitutional: Negative for chills, fatigue and fever.  HENT: Negative for congestion, rhinorrhea, sneezing and sore throat.   Respiratory: Negative for cough, shortness of breath and wheezing.   Cardiovascular: Negative for chest pain and palpitations.  Gastrointestinal: Negative for diarrhea, nausea and vomiting.  Genitourinary: Negative for difficulty urinating.  Musculoskeletal: Negative for myalgias.  Skin: Negative for color change.  Allergic/Immunologic: Negative for environmental allergies and food allergies.       NKDA  Neurological: Negative for dizziness, seizures, syncope, numbness and headaches.  Psychiatric/Behavioral: Positive for dysphoric mood (Stabilized with medication prior to discharge), hallucinations (Hx. of (Stabilized with medication prior to discharge)) and sleep disturbance (Stabilized with medication prior to discharge). Negative for agitation, behavioral problems, confusion, decreased  concentration, self-injury and suicidal ideas. The patient is nervous/anxious (Stabilized with medication prior to discharge). The patient is not hyperactive.     Blood pressure 94/67, pulse 89, temperature 98 F (36.7 C), temperature source Oral, resp. rate 18, height 6\' 1"  (1.854 m), weight 79.4 kg, SpO2 100 %.Body mass index is 23.09 kg/m.  See Md's discharge SRA  Sleep:  Number of Hours: 8.75   Have you used any form of tobacco in the last 30 days? (Cigarettes, Smokeless Tobacco, Cigars, and/or Pipes): Yes  Has this patient used any form of tobacco in the last 30 days? (Cigarettes, Smokeless Tobacco, Cigars, and/or Pipes): N/A  Blood Alcohol level:  Lab Results  Component Value Date   ETH <10 88/50/2774   Metabolic Disorder Labs:  Lab Results  Component Value Date   HGBA1C 5.4 08/16/2019   MPG 108.28 08/16/2019   Lab Results  Component Value Date   PROLACTIN 24.6 (H) 08/16/2019   Lab Results  Component Value Date   CHOL 178 08/16/2019   TRIG 213 (H) 08/16/2019   HDL 42 08/16/2019  CHOLHDL 4.2 08/16/2019   VLDL 43 (H) 08/16/2019   LDLCALC 93 08/16/2019   See Psychiatric Specialty Exam and Suicide Risk Assessment completed by Attending Physician prior to discharge.  Discharge destination:  Home  Is patient on multiple antipsychotic therapies at discharge:  No   Has Patient had three or more failed trials of antipsychotic monotherapy by history:  No  Recommended Plan for Multiple Antipsychotic Therapies: NA  Allergies as of 08/19/2019   No Known Allergies     Medication List    TAKE these medications     Indication  EnLyte Caps 1 a day  If not covered go to [email or web address]  Indication: Deficiency of Folic Acid   meclizine 50 MG tablet Commonly known as: ANTIVERT Take 0.5 tablets (25 mg total) by mouth 3 (three) times daily as needed for dizziness.  Indication: Motion Sickness   ondansetron 4 MG tablet Commonly known as: ZOFRAN Take 1 tablet (4 mg total)  by mouth every 8 (eight) hours as needed for nausea or vomiting.  Indication: Nausea and Vomiting      Follow-up recommendations: Activity:  As tolerated Diet: As recommended by your primary care doctor. Keep all scheduled follow-up appointments as recommended.   Comments: Prescriptions given at discharge.  Patient agreeable to plan.  Given opportunity to ask questions.  Appears to feel comfortable with discharge denies any current suicidal or homicidal thought. Patient is also instructed prior to discharge to: Take all medications as prescribed by his/her mental healthcare provider. Report any adverse effects and or reactions from the medicines to his/her outpatient provider promptly. Patient has been instructed & cautioned: To not engage in alcohol and or illegal drug use while on prescription medicines. In the event of worsening symptoms, patient is instructed to call the crisis hotline, 911 and or go to the nearest ED for appropriate evaluation and treatment of symptoms. To follow-up with his/her primary care provider for your other medical issues, concerns and or health care needs.  Signed: Armandina Stammer, NP, PMHNP, FNP_BC 08/19/2019, 9:35 AM

## 2019-08-19 NOTE — BHH Suicide Risk Assessment (Signed)
[facility] Discharge Suicide Risk Assessment   Principal Problem: History of polysubstance abuse/recent amphetamine abuse/schizophreniform presentation Discharge Diagnoses: Active Problems:   Schizophreniform disorder (HCC)   Psychosis (HCC)   Total Time spent with patient: 45 minutes  Musculoskeletal: Strength & Muscle Tone: within normal limits Gait & Station: normal Patient leans: N/A  Psychiatric Specialty Exam: Review of Systems  Blood pressure 94/67, pulse 89, temperature 98 F (36.7 C), temperature source Oral, resp. rate 18, height 6\' 1"  (1.854 m), weight 79.4 kg, SpO2 100 %.Body mass index is 23.09 kg/m.  General Appearance: Casual  Eye Contact::  Good  Speech:  Clear and Coherent409  Volume:  Normal  Mood:  Euthymic  Affect:  Congruent  Thought Process:  Coherent and Goal Directed  Orientation:  Full (Time, Place, and Person)  Thought Content:  Logical  Suicidal Thoughts:  No  Homicidal Thoughts:  No  Memory:  Immediate;   Fair Recent;   Fair Remote;   Fair  Judgement:  Fair  Insight:  Fair  Psychomotor Activity:  Normal  Concentration:  Good  Recall:  Good  Fund of Knowledge:Good  Language: Good  Akathisia:  Negative  Handed:  Right  AIMS (if indicated):     Assets:  Communication Skills Desire for Improvement  Sleep:  Number of Hours: 8.75  Cognition: WNL  ADL's:  Intact   Mental Status Per Nursing Assessment::   On Admission:  NA  Demographic Factors:  Male  Loss Factors: Decrease in vocational status  Historical Factors: Impulsivity  Risk Reduction Factors:   Living with another person, especially a relative and Positive social support  Continued Clinical Symptoms:  Alcohol/Substance Abuse/Dependencies  Cognitive Features That Contribute To Risk:  None    Suicide Risk:  Minimal: No identifiable suicidal ideation.  Patients presenting with no risk factors but with morbid ruminations; may be classified as minimal risk based on the severity  of the depressive symptoms    Plan Of Care/Follow-up recommendations:  Activity:  full  Laine Giovanetti, MD 08/19/2019, 9:05 AM

## 2019-08-19 NOTE — Progress Notes (Signed)
  [facility] Adult Case Management Discharge Plan :  Will you be returning to the same living situation after discharge:  Yes,  home. At discharge, do you have transportation home?: Yes,  mother will pick up at 12pm. Do you have the ability to pay for your medications: No. Provided samples.  Release of information consent forms completed and in the chart.  Patient to Follow up at: Follow-up Information    [facility]. Call.   Why: Please call to schedule an appointment for medication management and/or outpatient therapy. You will need your hospital discharge paperwork, photo ID, and insurance/income information. Contact information: [address] [place] [address] [phone]        Patient declines Follow up.   Contact information: Patient declines follow up.          Next level of care provider has access to [facility] Link:no  Safety Planning and Suicide Prevention discussed: Yes,  with mother.  Have you used any form of tobacco in the last 30 days? (Cigarettes, Smokeless Tobacco, Cigars, and/or Pipes): Yes  Has patient been referred to the Quitline?: Patient refused referral  Patient has been referred for addiction treatment: Pt. refused referral  Darreld Mclean, LCSWA 08/19/2019, 10:37 AM

## 2019-08-19 NOTE — Progress Notes (Signed)
   08/18/19 2030  Psych Admission Type (Psych Patients Only)  Admission Status Involuntary  Psychosocial Assessment  Patient Complaints None  Eye Contact Fair  Facial Expression Animated  Affect Appropriate to circumstance  Speech Logical/coherent  Interaction Assertive  Motor Activity Slow  Appearance/Hygiene Unremarkable  Behavior Characteristics Cooperative  Mood Pleasant  Thought Process  Coherency WDL  Content WDL  Delusions None reported or observed  Perception WDL  Hallucination None reported or observed  Judgment Poor  Confusion None  Danger to Self  Current suicidal ideation? Denies  Danger to Others  Danger to Others None reported or observed   Pt pleasant. Accepted Ensure and took his PRN meds for sleep.

## 2019-08-19 NOTE — Progress Notes (Signed)
Recreation Therapy Notes  Date: 3.10.21 Time: 1000 Location: [address]   Group Topic: Communication, Team Building, Problem Solving  Goal Area(s) Addresses:  Patient will effectively work with peer towards shared goal.  Patient will identify skill used to make activity successful.  Patient will identify how skills used during activity can be used to reach post d/c goals.   Intervention: STEM Activity   Activity: Moon Landing. Patients were provided the following materials: 5 drinking straws, 5 rubber bands, 5 paper clips, 2 index cards and 2 drinking cups. Using the provided materials patients were asked to build Lewis launching mechanisms to launch Lewis ping pong ball approximately 12 feet. Patients were divided into teams of 3-5.   Education: Social Skills, Discharge Planning.   Education Outcome: Acknowledges education/In group clarification offered/Needs additional education.   Clinical Observations/Feedback:  Pt did not attend group.     Novia Lansberry, LRT/CTRS         Jerome Lewis 08/19/2019 11:22 AM 

## 2024-01-16 ENCOUNTER — Other Ambulatory Visit: Payer: Self-pay

## 2024-01-16 ENCOUNTER — Encounter (HOSPITAL_COMMUNITY): Payer: Self-pay

## 2024-01-16 ENCOUNTER — Emergency Department (HOSPITAL_COMMUNITY)
Admission: EM | Admit: 2024-01-16 | Discharge: 2024-01-16 | Disposition: A | Discharge status: Home / Self Care | Payer: Self-pay | Attending: Emergency Medicine | Admitting: Emergency Medicine

## 2024-01-16 ENCOUNTER — Emergency Department (HOSPITAL_COMMUNITY): Payer: Self-pay

## 2024-01-16 DIAGNOSIS — W208XXA Other cause of strike by thrown, projected or falling object, initial encounter: Secondary | ICD-10-CM | POA: Insufficient documentation

## 2024-01-16 DIAGNOSIS — S92901A Unspecified fracture of right foot, initial encounter for closed fracture: Secondary | ICD-10-CM

## 2024-01-16 DIAGNOSIS — M79671 Pain in right foot: Secondary | ICD-10-CM | POA: Insufficient documentation

## 2024-01-16 DIAGNOSIS — S92311A Displaced fracture of first metatarsal bone, right foot, initial encounter for closed fracture: Secondary | ICD-10-CM | POA: Insufficient documentation

## 2024-01-16 MED ORDER — KETOROLAC TROMETHAMINE 15 MG/ML IJ SOLN: 15.0000 mg | Freq: Once | INTRAMUSCULAR | Status: DC

## 2024-01-16 MED ORDER — KETOROLAC TROMETHAMINE 15 MG/ML IJ SOLN
30.0000 mg | Freq: Once | INTRAMUSCULAR | Status: AC
  Administered 2024-01-16: 30 mg via INTRAMUSCULAR
  Filled 2024-01-16: qty 2

## 2024-01-16 NOTE — Discharge Instructions (Addendum)
 You were seen today for foot pain. While you were here we monitored your vitals, preformed a physical exam, and you have a fracture of your foot near big toe.   Things to do:  - Follow up with your primary care provider within the next 1-2 weeks - Please alternate Tylenol  and ibuprofen for your pain.  Orthopedics will contact you for follow-up next week  Return to the emergency department if you have any new or worsening symptoms including , or if you have any other concerns.

## 2024-01-16 NOTE — ED Triage Notes (Signed)
 Pt states gate of trailer landed on pts right foot 4 days ago. C/O top right foot pain. Swelling to right foot. No lacerations noted. Pt ambulatory.

## 2024-01-16 NOTE — ED Notes (Signed)
 Patient transported to X-ray

## 2024-01-16 NOTE — Progress Notes (Signed)
 Orthopedic Tech Progress Note Patient Details:  Jerome Lewis 03/01/1991 [id number]  Ortho Devices Type of Ortho Device: Postop shoe/boot Ortho Device/Splint Location: LLE Ortho Device/Splint Interventions: Application, Ordered   Post Interventions Patient Tolerated: Well  Travarius Lange A Tuan Tippin 01/16/2024, 10:00 AM

## 2024-01-16 NOTE — ED Provider Notes (Signed)
 [employer] EMERGENCY DEPARTMENT AT [facility] Provider Note  MDM   HPI/ROS:  Jerome Lewis is a 33 y.o. male with a medical history as below who presents with right foot pain.  Patient states he dropped a trailer on it 3 days ago.  He states the pain and swelling continues to worsen which prompted his visit to the emergency department.  He endorses some tingling to the foot with numbness.  States he has been able to bear some weight.  Physical exam is notable for: - Swollen, bruised right foot with diffuse tenderness to palpation particularly over the first metatarsal.  Sensation intact with good 2 point discrimination.  Full ROM with significant pain in the big toe.  Good capillary refill 2+ DP pulse  On my initial evaluation, patient is:  -Vital signs stable. Patient afebrile, hemodynamically stable, and non-toxic appearing.   This patient's current presentation, including their history and physical exam, is most consistent with fracture. Differentials include fracture, contusion, compartment syndrome.    On physical exam patient does have swelling significant tenderness and bruising to the anterior and posterior aspect of the right foot.  Tenderness most significant over the first metatarsal.  XRs obtained which show first metatarsal fracture with mild displacement.  He is neurovascularly intact with good sensation capillary refill is low concern for compartment syndrome.  Did speak with orthopedics who recommend protective shoe and follow-up in their clinic.  He remained neurovascularly intact on my exam.  At this time discharged in stable condition with strict return precautions, postop shoe, and crutches.    Interpretations, interventions, and the patient's course of care are documented below.    Clinical Course as of 01/16/24 0911  Thu Jan 16, 2024  0832 DG Foot Complete Right Mildly displaced fracture of the first metatarsal [RC]    Clinical Course User Index [RC]  Sharyne Darina RAMAN, MD      Disposition:  I discussed the plan for discharge with the patient and/or their surrogate at bedside prior to discharge and they were in agreement with the plan and verbalized understanding of the return precautions provided. All questions answered to the best of my ability. Ultimately, the patient was discharged in stable condition with stable vital signs. I am reassured that they are capable of close follow up and good social support at home.   Clinical Impression:  1. Foot fracture, right, closed, initial encounter     Rx / DC Orders ED Discharge Orders          Ordered    AMB referral to orthopedics       Comments: reynolds   01/16/24 0910            The plan for this patient was discussed with Dr. Freddi, who voiced agreement and who oversaw evaluation and treatment of this patient.   Clinical Complexity A medically appropriate history, review of systems, and physical exam was performed.  My independent interpretations of EKG, labs, and radiology are documented in the ED course above.   If decision rules were used in this patient's evaluation, they are listed below.   Click here for ABCD2, HEART and other calculatorsREFRESH Note before signing   Patient's presentation is most consistent with acute presentation with potential threat to life or bodily function.  Medical Decision Making Amount and/or Complexity of Data Reviewed Radiology:  Decision-making details documented in ED Course.  Risk Prescription drug management.    HPI/ROS      See MDM section  for pertinent HPI and ROS. A complete ROS was performed with pertinent positives/negatives noted above.   History reviewed. No pertinent past medical history.  History reviewed. No pertinent surgical history.    Physical Exam   Vitals:   01/16/24 0724 01/16/24 0727 01/16/24 0729  BP: (!) 140/94    Pulse: (!) 108    Resp: 18    Temp:   (!) 97.3 F (36.3 C)  TempSrc:   Oral   SpO2: 99%    Weight:  77.1 kg   Height:  6' 1 (1.854 m)     Physical Exam Musculoskeletal:     Comments: Swollen, bruised right foot with diffuse tenderness to palpation particularly over the first metatarsal.  Sensation intact with good 2 point discrimination.  Full ROM with significant pain in the big toe.  Good capillary refill 2+ DP pulse       Procedures   If procedures were preformed on this patient, they are listed below:  Procedures   @BBSIG @   Please note that this documentation was produced with the assistance of voice-to-text technology and may contain errors.    Sharyne Darina RAMAN, MD 01/16/24 9087    Freddi Hamilton, MD 01/16/24 1005
